# Patient Record
Sex: Female | Born: 1990 | Race: White | Hispanic: No | Marital: Single | Smoking: Never smoker
Health system: Southern US, Community
[De-identification: ages and names within clinical notes are randomized; demographics above are authoritative.]

## PROBLEM LIST (undated history)

## (undated) DIAGNOSIS — L509 Urticaria, unspecified: Secondary | ICD-10-CM

## (undated) DIAGNOSIS — T7840XA Allergy, unspecified, initial encounter: Secondary | ICD-10-CM

## (undated) DIAGNOSIS — J45909 Unspecified asthma, uncomplicated: Secondary | ICD-10-CM

## (undated) DIAGNOSIS — F988 Other specified behavioral and emotional disorders with onset usually occurring in childhood and adolescence: Secondary | ICD-10-CM

## (undated) HISTORY — DX: Unspecified asthma, uncomplicated: J45.909

## (undated) HISTORY — DX: Allergy, unspecified, initial encounter: T78.40XA

## (undated) HISTORY — DX: Urticaria, unspecified: L50.9

---

## 2013-06-08 ENCOUNTER — Encounter (HOSPITAL_BASED_OUTPATIENT_CLINIC_OR_DEPARTMENT_OTHER): Payer: Self-pay | Admitting: Emergency Medicine

## 2013-06-08 ENCOUNTER — Emergency Department (HOSPITAL_BASED_OUTPATIENT_CLINIC_OR_DEPARTMENT_OTHER): Payer: BC Managed Care – PPO

## 2013-06-08 DIAGNOSIS — Z79899 Other long term (current) drug therapy: Secondary | ICD-10-CM | POA: Insufficient documentation

## 2013-06-08 DIAGNOSIS — S93409A Sprain of unspecified ligament of unspecified ankle, initial encounter: Secondary | ICD-10-CM | POA: Insufficient documentation

## 2013-06-08 DIAGNOSIS — Y929 Unspecified place or not applicable: Secondary | ICD-10-CM | POA: Insufficient documentation

## 2013-06-08 DIAGNOSIS — Y939 Activity, unspecified: Secondary | ICD-10-CM | POA: Insufficient documentation

## 2013-06-08 DIAGNOSIS — F988 Other specified behavioral and emotional disorders with onset usually occurring in childhood and adolescence: Secondary | ICD-10-CM | POA: Insufficient documentation

## 2013-06-08 DIAGNOSIS — W010XXA Fall on same level from slipping, tripping and stumbling without subsequent striking against object, initial encounter: Secondary | ICD-10-CM | POA: Insufficient documentation

## 2013-06-08 DIAGNOSIS — Z880 Allergy status to penicillin: Secondary | ICD-10-CM | POA: Insufficient documentation

## 2013-06-08 NOTE — ED Notes (Signed)
Left ankle pain s/p injury, no deformity noted

## 2013-06-09 ENCOUNTER — Emergency Department (HOSPITAL_BASED_OUTPATIENT_CLINIC_OR_DEPARTMENT_OTHER)
Admission: EM | Admit: 2013-06-09 | Discharge: 2013-06-09 | Disposition: A | Discharge status: Home / Self Care | Payer: BC Managed Care – PPO | Attending: Emergency Medicine | Admitting: Emergency Medicine

## 2013-06-09 DIAGNOSIS — S93402A Sprain of unspecified ligament of left ankle, initial encounter: Secondary | ICD-10-CM

## 2013-06-09 HISTORY — DX: Other specified behavioral and emotional disorders with onset usually occurring in childhood and adolescence: F98.8

## 2013-06-09 MED ORDER — IBUPROFEN 600 MG PO TABS: 600.0000 mg | ORAL_TABLET | Freq: Four times a day (QID) | ORAL | Status: DC | PRN

## 2013-06-09 MED ORDER — IBUPROFEN 400 MG PO TABS
600.0000 mg | ORAL_TABLET | Freq: Once | ORAL | Status: AC
  Administered 2013-06-09: 02:00:00 600 mg via ORAL
  Filled 2013-06-09 (×2): qty 1

## 2013-06-09 NOTE — ED Provider Notes (Signed)
CSN: [id number]     Arrival date & time 06/08/13  2238 History   First MD Initiated Contact with Patient 06/09/13 0057     Chief Complaint  Patient presents with  . Ankle Pain   (Consider location/radiation/quality/duration/timing/severity/associated sxs/prior Treatment) HPI Patient presents with left ankle pain and swelling after tripping any dig earlier this evening. She denies any other injury specifically to her head or neck. She is having no numbness or tingling. She has no focal weakness. She denies any pain near around the knee. Past Medical History  Diagnosis Date  . ADD (attention deficit disorder)    History reviewed. No pertinent past surgical history. History reviewed. No pertinent family history. History  Substance Use Topics  . Smoking status: Never Smoker   . Smokeless tobacco: Not on file  . Alcohol Use: No   OB History   Grav Para Term Preterm Abortions TAB SAB Ect Mult Living                 Review of Systems  Musculoskeletal: Positive for arthralgias and joint swelling. Negative for neck pain and neck stiffness.  Skin: Negative for rash.  Neurological: Negative for weakness, numbness and headaches.  All other systems reviewed and are negative.    Allergies  Penicillins  Home Medications   Current Outpatient Rx  Name  Route  Sig  Dispense  Refill  . lisdexamfetamine (VYVANSE) 70 MG capsule   Oral   Take 70 mg by mouth daily.         Marland Kitchen ibuprofen (ADVIL,MOTRIN) 600 MG tablet   Oral   Take 1 tablet (600 mg total) by mouth every 6 (six) hours as needed.   30 tablet   0    LMP 06/06/2013 Physical Exam  Nursing note and vitals reviewed. Constitutional: She is oriented to person, place, and time. She appears well-developed and well-nourished. No distress.  HENT:  Head: Normocephalic and atraumatic.  Eyes: EOM are normal. Pupils are equal, round, and reactive to light.  Neck: Normal range of motion. Neck supple.  No posterior midline tenderness  to palpation.  Cardiovascular: Normal rate and regular rhythm.   Pulmonary/Chest: Effort normal and breath sounds normal. No respiratory distress. She has no wheezes. She has no rales.  Abdominal: Soft. Bowel sounds are normal. She exhibits no distension and no mass. There is no tenderness. There is no rebound and no guarding.  Musculoskeletal: Normal range of motion. She exhibits tenderness (tenderness to palpation over the left lateral malleolus and just distal to the malleolus. She has obvious swelling.). She exhibits no edema.  No foot tenderness. No proximal fibular tenderness. 2+ dorsalis pedis pulses.  Neurological: She is alert and oriented to person, place, and time.  Moves all extremities without deficit. Sensation grossly intact.  Skin: Skin is warm and dry. No rash noted. No erythema.  Psychiatric: She has a normal mood and affect. Her behavior is normal.    ED Course  Procedures (including critical care time) Labs Review Labs Reviewed - No data to display Imaging Review Dg Ankle Complete Left  06/08/2013   CLINICAL DATA:  Trauma with ankle pain and swelling  EXAM: LEFT ANKLE COMPLETE - 3+ VIEW  COMPARISON:  None.  FINDINGS: There is no evidence of fracture, dislocation, or joint effusion. There is no evidence of arthropathy or other focal bone abnormality.  Periarticular soft tissue swelling, especially laterally.  IMPRESSION: Soft tissue swelling without fracture.   Electronically Signed   By: Tiburcio Pea  M.D.   On: 06/08/2013 23:49    EKG Interpretation   None       MDM   1. Ankle sprain, left, initial encounter    We'll treat for ankle sprain. Advised to keep elevated and use ice for swelling. Return precautions have been given.    Loren Raceravid Juel Ripley, MD 06/09/13 [phone]

## 2013-06-09 NOTE — Discharge Instructions (Signed)

## 2013-11-02 DIAGNOSIS — S93439A Sprain of tibiofibular ligament of unspecified ankle, initial encounter: Secondary | ICD-10-CM | POA: Insufficient documentation

## 2014-01-18 DIAGNOSIS — M659 Unspecified synovitis and tenosynovitis, unspecified site: Secondary | ICD-10-CM | POA: Insufficient documentation

## 2015-02-27 IMAGING — CR DG ANKLE COMPLETE 3+V*L*
3 series · 3 of 3 positions shown · non-contrast
Comparison: None.

CLINICAL DATA: Trauma with ankle pain and swelling

EXAM:
LEFT ANKLE COMPLETE - 3+ VIEW

[t ankle joint ap left]
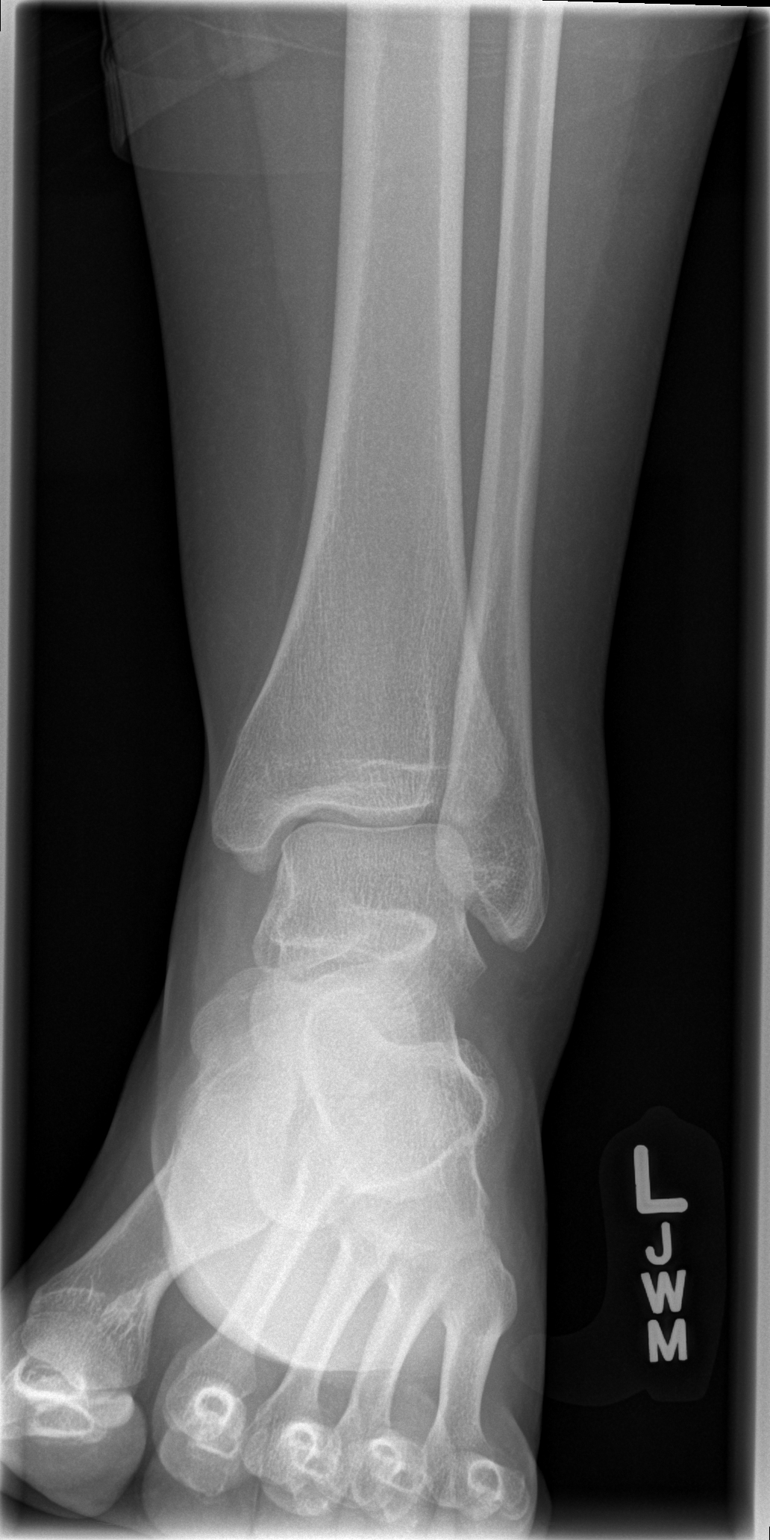

[t ankle joint oblique left]
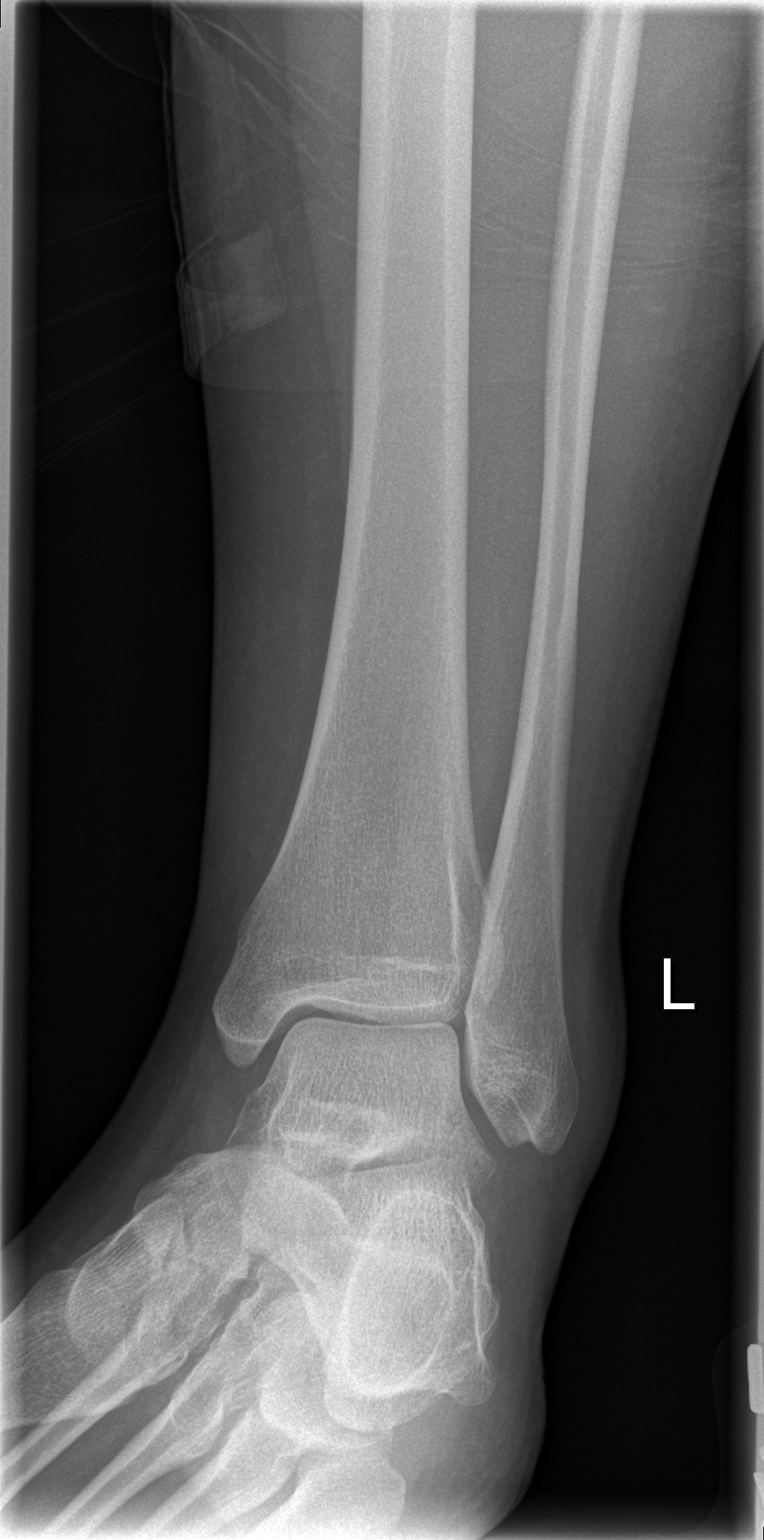

[t ankle joint lat left]
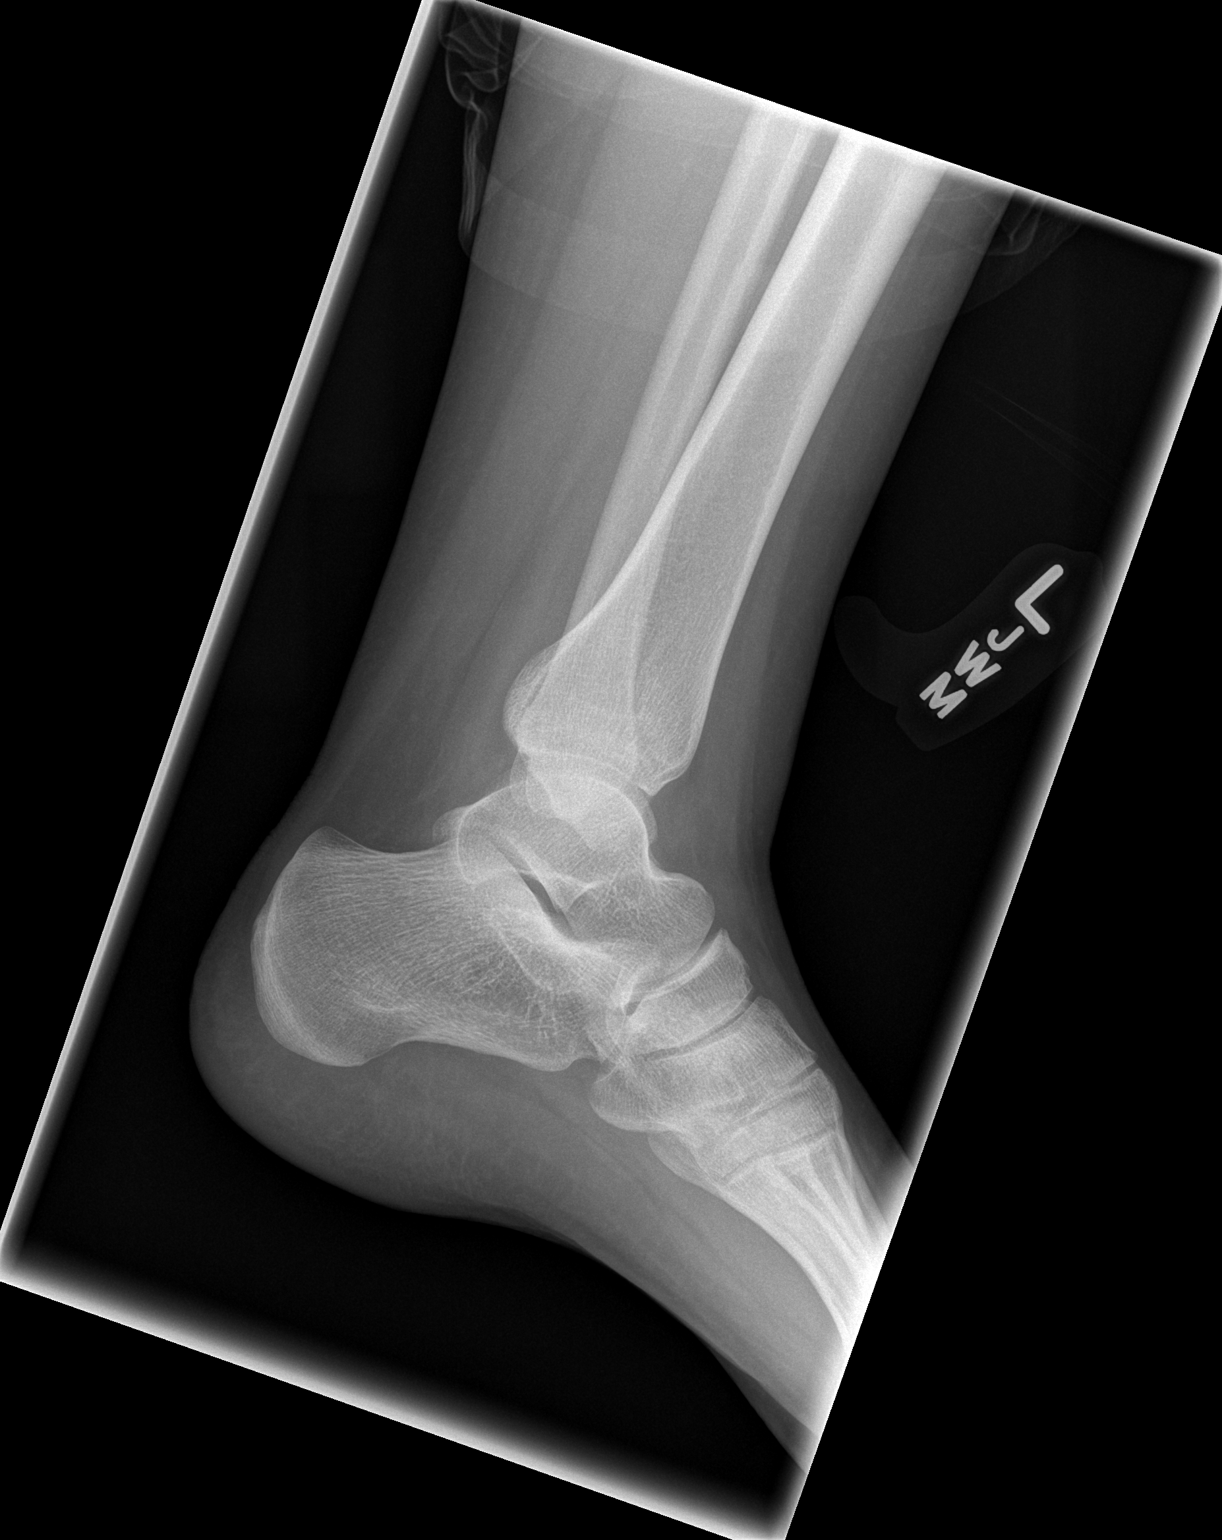

[3 of 3 positions shown; findings below may reference images not displayed]

FINDINGS: There is no evidence of fracture, dislocation, or joint effusion.
There is no evidence of arthropathy or other focal bone abnormality.

Periarticular soft tissue swelling, especially laterally.
IMPRESSION: Soft tissue swelling without fracture.

## 2017-04-28 ENCOUNTER — Encounter (HOSPITAL_BASED_OUTPATIENT_CLINIC_OR_DEPARTMENT_OTHER): Payer: Self-pay

## 2017-04-28 ENCOUNTER — Emergency Department (HOSPITAL_BASED_OUTPATIENT_CLINIC_OR_DEPARTMENT_OTHER)
Admission: EM | Admit: 2017-04-28 | Discharge: 2017-04-28 | Disposition: A | Discharge status: Home / Self Care | Payer: Worker's Compensation | Attending: Emergency Medicine | Admitting: Emergency Medicine

## 2017-04-28 DIAGNOSIS — S61211A Laceration without foreign body of left index finger without damage to nail, initial encounter: Secondary | ICD-10-CM | POA: Diagnosis not present

## 2017-04-28 DIAGNOSIS — W260XXA Contact with knife, initial encounter: Secondary | ICD-10-CM | POA: Insufficient documentation

## 2017-04-28 DIAGNOSIS — Y929 Unspecified place or not applicable: Secondary | ICD-10-CM | POA: Insufficient documentation

## 2017-04-28 DIAGNOSIS — Y998 Other external cause status: Secondary | ICD-10-CM | POA: Insufficient documentation

## 2017-04-28 DIAGNOSIS — Z23 Encounter for immunization: Secondary | ICD-10-CM | POA: Diagnosis not present

## 2017-04-28 DIAGNOSIS — Y93G1 Activity, food preparation and clean up: Secondary | ICD-10-CM | POA: Insufficient documentation

## 2017-04-28 MED ORDER — TETANUS-DIPHTH-ACELL PERTUSSIS 5-2.5-18.5 LF-MCG/0.5 IM SUSP
0.5000 mL | Freq: Once | INTRAMUSCULAR | Status: AC
  Administered 2017-04-28: 0.5 mL via INTRAMUSCULAR
  Filled 2017-04-28: qty 0.5

## 2017-04-28 MED ORDER — BUPIVACAINE HCL 0.25 % IJ SOLN
5.0000 mL | Freq: Once | INTRAMUSCULAR | Status: AC
  Administered 2017-04-28: 5 mL
  Filled 2017-04-28: qty 1

## 2017-04-28 NOTE — ED Provider Notes (Signed)
[facility] EMERGENCY DEPARTMENT Provider Note   CSN: [id number] Arrival date & time: 04/28/17  1101     History   Chief Complaint Chief Complaint  Patient presents with  . Laceration    index finger    HPI Janet Spencer is a 26 y.o. female.  HPI  Patient is a 26 year old female with a history of ADD presenting for a laceration to her left index finger.  Patient reports that she was cutting lemons at work, where she works in [employer], [employer] at a [employer] when her finger slid under the knife.  Hemostasis immediately applied. Patient reports she has decreased sensation distal to the cut, but no loss of sensation. No weakness of the finger. Tdap not up to date.  Past Medical History:  Diagnosis Date  . ADD (attention deficit disorder)     There are no active problems to display for this patient.   History reviewed. No pertinent surgical history.  OB History    No data available       Home Medications    Prior to Admission medications   Medication Sig Start Date End Date Taking? Authorizing Provider  ibuprofen (ADVIL,MOTRIN) 600 MG tablet Take 1 tablet (600 mg total) by mouth every 6 (six) hours as needed. 06/09/13   Loren RacerYelverton, David, MD  lisdexamfetamine (VYVANSE) 70 MG capsule Take 70 mg by mouth daily.    [provider]    Family History No family history on file.  Social History Social History   Tobacco Use  . Smoking status: Never Smoker  . Smokeless tobacco: Never Used  Substance Use Topics  . Alcohol use: No  . Drug use: No     Allergies   Penicillins   Review of Systems Review of Systems  Skin: Positive for wound.  Neurological: Negative for weakness and numbness.     Physical Exam Updated Vital Signs BP 115/76 (BP Location: Right Arm)   Pulse 69   Temp 98.1 F (36.7 C) (Oral)   Resp 18   Ht 5\' 9"  (1.753 m)   Wt 62.1 kg (137 lb)   SpO2 100%   BMI 20.23 kg/m   Physical Exam  Constitutional: She appears  well-developed and well-nourished. No distress.  Sitting comfortably in bed.  HENT:  Head: Normocephalic and atraumatic.  Eyes: Conjunctivae are normal. Right eye exhibits no discharge. Left eye exhibits no discharge.  EOMs normal to gross examination.  Neck: Normal range of motion.  Cardiovascular: Normal rate and regular rhythm.  Intact, 2+ radial and ulnar pulse of the left hand.  Pulmonary/Chest:  Normal respiratory effort. Patient converses comfortably. No audible wheeze or stridor.  Abdominal: She exhibits no distension.  Musculoskeletal: Normal range of motion.  Left Hand Exam:  Inspection: No swelling or deformity. There is a 1cm superficial laceration overlying the extensor surface of the left 2nd digit, extending up to but not involving the nail matrix. Palpation: No point tenderness, crepitus, tenderness over anatomic snuffbox, or scapholunate joint tenderness ROM: Passive/active ROM intact at wrist, MCP, PIP, and DIP joints, thumb MCP and IP joints. Ligamentous stability: No laxity to valgus/varus stress of MCP, PIP, or DIP joints. No joint laxity with radial stress of thumb. Flexor/Extensor tendons: FDS/FDP tendons intact in digits 2-5 at PIP/DIP joints, respectively; extensor tendons intact in all digits Nerve testing: -Sensation to light touch intact to the distal tip of the left 2nd digit.  -Radial: Sensation to light touch intact at dorsal CMC joint. -Median: Sensation to  light touch intact on palmar side of 1st and 2nd digits. -Ulnar: Sensation to light touch intact on palmar side of 5th digit.  Vascular: 2+ radial and ulnar pulses. Capillary refill <2 seconds  Neurological: She is alert.  Cranial nerves intact to gross observation. Patient moves extremities symmetrically and with good coordination.  Skin: Skin is warm and dry. She is not diaphoretic.  Psychiatric: She has a normal mood and affect. Her behavior is normal. Judgment and thought content normal.  Nursing  note and vitals reviewed.    ED Treatments / Results  Labs (all labs ordered are listed, but only abnormal results are displayed) Labs Reviewed - No data to display  EKG  EKG Interpretation None       Radiology No results found.  Procedures .Marland Kitchen.Laceration Repair Date/Time: 04/29/2017 9:07 AM Performed by: Elisha PonderMurray, Lakashia Collison B, PA-C Authorized by: Elisha PonderMurray, Falana Clagg B, PA-C   Consent:    Consent obtained:  Verbal   Consent given by:  Patient   Risks discussed:  Infection, pain and poor cosmetic result Anesthesia (see MAR for exact dosages):    Anesthesia method:  Nerve block   Block needle gauge:  25 G   Block anesthetic:  Bupivacaine 0.25% w/o epi   Block injection procedure:  Anatomic landmarks identified, incremental injection and introduced needle   Block outcome:  Anesthesia achieved Laceration details:    Location:  Finger   Finger location:  L index finger   Length (cm):  1 Pre-procedure details:    Preparation:  Patient was prepped and draped in usual sterile fashion Exploration:    Hemostasis achieved with:  Tourniquet   Contaminated: no   Treatment:    Area cleansed with:  Betadine   Amount of cleaning:  Standard   Irrigation solution:  Tap water   Irrigation volume:  3 minutes under tap water Skin repair:    Repair method:  Sutures   Suture size:  5-0   Suture material:  Prolene   Number of sutures:  3 Approximation:    Approximation:  Close   Vermilion border: well-aligned   Post-procedure details:    Dressing:  Splint for protection and non-adherent dressing   Patient tolerance of procedure:  Tolerated well, no immediate complications   (including critical care time)  Medications Ordered in ED Medications  Tdap (BOOSTRIX) injection 0.5 mL (0.5 mLs Intramuscular Given 04/28/17 1252)  bupivacaine (MARCAINE) 0.25 % (with pres) injection 5 mL (5 mLs Infiltration Given 04/28/17 1258)     Initial Impression / Assessment and Plan / ED Course  I have  reviewed the triage vital signs and the nursing notes.  Pertinent labs & imaging results that were available during my care of the patient were reviewed by me and considered in my medical decision making (see chart for details).     Final Clinical Impressions(s) / ED Diagnoses   Final diagnoses:  Laceration of left index finger without foreign body without damage to nail, initial encounter   Patient with superficial laceration to the extensor surface of left index finger. Patient is neurovascularly intact. Repaired with 3 prolene sutures today. Tdap updated today. Patient tolerated procedure well. Return precautions given for any erythema, drainage, or increased swelling. Patient is in understanding and agrees with the plan of care.   ED Discharge Orders    None       Delia ChimesMurray, Marquel Spoto B, PA-C 04/29/17 [id number]    Rolan BuccoBelfi, Melanie, MD 04/29/17 [phone]

## 2017-04-28 NOTE — Discharge Instructions (Signed)
Please read and follow all provided instructions.  Your diagnoses today include:  1. Laceration of left index finger without foreign body without damage to nail, initial encounter     Tests performed today include: Vital signs. See below for your results today.   Medications prescribed:   Take any prescribed medications only as directed.    Home care instructions:  Follow any educational materials and wound care instructions contained in this packet.   Keep affected area above the level of your heart when possible to minimize swelling. Wash area gently twice a day with warm soapy water. Do not apply alcohol or hydrogen peroxide. Cover the area if it draining or weeping.   Follow-up instructions: Suture Removal: Return to the Emergency Department or see your primary care care doctor in 7-10 days for a recheck of your wound and removal of your sutures or staples.    Return instructions:  Return to the Emergency Department if you have: Fever Worsening pain Worsening swelling of the wound Pus draining from the wound Redness of the skin that moves away from the wound, especially if it streaks away from the affected area  Any other emergent concerns  Your vital signs today were: BP 128/80 (BP Location: Left Arm)    Pulse 70    Temp 98.1 F (36.7 C) (Oral)    Resp 16    Ht 5\' 9"  (1.753 m)    Wt 62.1 kg (137 lb)    SpO2 100%    BMI 20.23 kg/m  If your blood pressure (BP) was elevated above 135/85 this visit, please have this repeated by your doctor within one month. --------------

## 2017-04-28 NOTE — ED Notes (Signed)
D/c home with family and given note for work

## 2017-04-28 NOTE — ED Triage Notes (Signed)
Pt cut her lt index finger while cutting lemons at work; bleeding controlled

## 2017-04-28 NOTE — ED Notes (Signed)
Per pt's manager "Pt needs a urine drug screen" EMT relayed information to pt and pt attempted to provide a sample for urine drug screen and did not give enough urine to get a temperature off the sample. EMT advised pt she has a 45 minute window to be able to provide another sample or we cannot perform the drug screen. EMT relayed such information to pt's manager and then pt's manager stated "Then just forego the test if she's going to have to wait. Just don't worry about it."

## 2017-04-28 NOTE — ED Notes (Signed)
EMT at bedside applying splint 

## 2017-04-28 NOTE — ED Notes (Signed)
ED Provider at bedside. 

## 2018-11-29 ENCOUNTER — Ambulatory Visit (INDEPENDENT_AMBULATORY_CARE_PROVIDER_SITE_OTHER): Payer: BC Managed Care – PPO | Admitting: Allergy and Immunology

## 2018-11-29 ENCOUNTER — Other Ambulatory Visit: Payer: Self-pay

## 2018-11-29 ENCOUNTER — Encounter: Payer: Self-pay | Admitting: Allergy and Immunology

## 2018-11-29 VITALS — HR 72 | Resp 16 | Ht 68.0 in | Wt 156.7 lb

## 2018-11-29 DIAGNOSIS — J3089 Other allergic rhinitis: Secondary | ICD-10-CM

## 2018-11-29 DIAGNOSIS — R062 Wheezing: Secondary | ICD-10-CM

## 2018-11-29 DIAGNOSIS — H1013 Acute atopic conjunctivitis, bilateral: Secondary | ICD-10-CM

## 2018-11-29 DIAGNOSIS — L500 Allergic urticaria: Secondary | ICD-10-CM

## 2018-11-29 DIAGNOSIS — J4530 Mild persistent asthma, uncomplicated: Secondary | ICD-10-CM | POA: Insufficient documentation

## 2018-11-29 DIAGNOSIS — T7840XD Allergy, unspecified, subsequent encounter: Secondary | ICD-10-CM

## 2018-11-29 DIAGNOSIS — H1010 Acute atopic conjunctivitis, unspecified eye: Secondary | ICD-10-CM | POA: Insufficient documentation

## 2018-11-29 DIAGNOSIS — K2970 Gastritis, unspecified, without bleeding: Secondary | ICD-10-CM

## 2018-11-29 DIAGNOSIS — T7840XA Allergy, unspecified, initial encounter: Secondary | ICD-10-CM | POA: Insufficient documentation

## 2018-11-29 MED ORDER — FLUTICASONE PROPIONATE 50 MCG/ACT NA SUSP: NASAL | 5 refills | Status: DC

## 2018-11-29 MED ORDER — LEVOCETIRIZINE DIHYDROCHLORIDE 5 MG PO TABS: 5.0000 mg | ORAL_TABLET | Freq: Every day | ORAL | 5 refills | Status: AC | PRN

## 2018-11-29 MED ORDER — FAMOTIDINE 20 MG PO TABS: ORAL_TABLET | ORAL | 5 refills | Status: DC

## 2018-11-29 MED ORDER — ALBUTEROL SULFATE HFA 108 (90 BASE) MCG/ACT IN AERS: 2.0000 | INHALATION_SPRAY | RESPIRATORY_TRACT | 1 refills | Status: DC | PRN

## 2018-11-29 MED ORDER — PAZEO 0.7 % OP SOLN: 1.0000 [drp] | Freq: Every day | OPHTHALMIC | 5 refills | Status: DC | PRN

## 2018-11-29 NOTE — Assessment & Plan Note (Signed)
The patient's history suggests mild intermittent asthma.  Spirometry today, while asymptomatic, does not meet ATS criteria for that diagnosis.  A prescription has been provided for albuterol HFA, 1 to 2 inhalations every 4-6 hours as needed.  The albuterol will be used as a therapeutic trial.  Subjective and objective measures of pulmonary function will be followed and the treatment plan will be adjusted accordingly.

## 2018-11-29 NOTE — Assessment & Plan Note (Signed)
   Treatment plan as outlined above for allergic rhinitis.  A prescription has been provided for Pazeo, one drop per eye daily as needed.  I have also recommended eye lubricant drops (i.e., Natural Tears) as needed. 

## 2018-11-29 NOTE — Progress Notes (Signed)
New Patient Note  RE: Janet Spencer MRN: [id number] DOB: [birth year] Date of Office Visit: 11/29/2018  Referring provider: Orvis Brill, MD Primary care provider: Orvis Brill, MD  Chief Complaint: Allergic Rhinitis  and Urticaria  History of present illness: Janet Spencer is a 28 y.o. female seen today in consultation requested by Janet Iha, MD.  Over the past 15 years, Janet Spencer has experienced recurrent episodes of hives. The hives have appeared at different times over her neck, back, arms, legs and feet.  The lesions are described as erythematous, raised, and pruritic.  Individual hives last less than 24 hours without leaving residual pigmentation or bruising. She denies concomitant angioedema, cardiopulmonary symptoms and GI symptoms. She has not experienced unexpected weight loss, recurrent fevers or drenching night sweats. Worse in spring and summer and with hot showers. Otherwise, no specific medication, food, skin care product, detergent, soap, or other environmental triggers have been identified. The symptoms do not seem to correlate with NSAIDs use or emotional stress. She did not have symptoms consistent with a respiratory tract infection at the time of symptom onset. Janet Spencer has tried to control symptoms with OTC antihistamines which have offered minimal temporary relief. She has not been evaluated and treated in the emergency department for these symptoms. Skin biopsy has not been performed. Recently, she has been experiencing hives a few times a week.  Janet Spencer experiences occasional heartburn and at one point was prescribed a proton pump inhibitor for gastroesophageal reflux disease, however she never started this medication. Janet Spencer experiences nasal congestion, rhinorrhea, sneezing, postnasal drainage, nasal pruritus, ocular pruritus, and lacrimation.  She states that on occasion she experiences ocular "on fire itching."  These symptoms occur year-round but are more frequent and  severe with pollen exposure in the springtime and in the fall.  She attempts to control the symptoms with cetirizine and/or Bepreve eyedrops. Janet Spencer complains of occasional dyspnea and wheezing.  Most recently, this occurred approximately 2 months ago while she was at work.  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  She also experiences dyspnea and "trouble catching (her) breath" with exercise.  Assessment and plan: Recurrent urticaria Unclear etiology. Skin tests to select food allergens were negative today. NSAIDs and emotional stress commonly exacerbate urticaria but are not the underlying etiology in this case. Physical urticarias are negative by history (i.e. pressure-induced, temperature, vibration, solar, etc.). History and lesions are not consistent with urticaria pigmentosa so I am not suspicious for mastocytosis. There are no concomitant symptoms concerning for anaphylaxis or constitutional symptoms worrisome for an underlying malignancy. We will rule out other potential etiologies with labs. For symptom relief, patient is to take oral antihistamines as directed. The following labs have been ordered: FCeRI antibody, anti-thyroglobulin antibody, thyroid peroxidase antibody, tryptase, urea breath test, CBC, CMP, ESR, ANA, and alpha gal panel.  The patient will be called with further recommendations after lab results have returned.  Instructions have been discussed and provided for H1/H2 receptor blockade with titration to find lowest effective dose.  A prescription has been provided for levocetirizine (Xyzal), 5 mg daily as needed.  A prescription has been provided for famotidine (Pepcid) 20 mg twice daily as needed.  Should there be a significant increase or change in symptoms, a journal is to be kept recording any foods eaten, beverages consumed, medications taken within a 6 hour period prior to the onset of symptoms, as well as record activities being performed, and  environmental conditions. For any symptoms concerning  for anaphylaxis, 911 is to be called immediately.  Perennial and seasonal allergic rhinitis  Aeroallergen avoidance measures have been discussed and provided in written form.  Levocetirizine has been prescribed (as above).  A prescription has been provided for fluticasone nasal spray, 2 sprays per nostril daily as needed. Proper nasal spray technique has been discussed and demonstrated.  Nasal saline spray (i.e., Simply Saline) or nasal saline lavage (i.e., NeilMed) is recommended as needed and prior to medicated nasal sprays.  If allergen avoidance measures and medications fail to adequately relieve symptoms, aeroallergen immunotherapy will be considered.  Allergic conjunctivitis  Treatment plan as outlined above for allergic rhinitis.  A prescription has been provided for Pazeo, one drop per eye daily as needed.  I have also recommended eye lubricant drops (i.e., Natural Tears) as needed.  Wheezing The patient's history suggests mild intermittent asthma.  Spirometry today, while asymptomatic, does not meet ATS criteria for that diagnosis.  A prescription has been provided for albuterol HFA, 1 to 2 inhalations every 4-6 hours as needed.  The albuterol will be used as a therapeutic trial.  Subjective and objective measures of pulmonary function will be followed and the treatment plan will be adjusted accordingly.   Meds ordered this encounter  Medications   levocetirizine (XYZAL) 5 MG tablet    Sig: Take 1 tablet (5 mg total) by mouth daily as needed for allergies. One tablet twice daily as needed for itching    Dispense:  30 tablet    Refill:  5   fluticasone (FLONASE) 50 MCG/ACT nasal spray    Sig: 2 sprays per nostril daily as needed for stuffy nose.    Dispense:  16 g    Refill:  5   Olopatadine HCl (PAZEO) 0.7 % SOLN    Sig: Place 1 drop into both eyes daily as needed.    Dispense:  2.5 mL    Refill:  5     Please keep rx on file. Pt. Will call when needed/   albuterol (PROAIR HFA) 108 (90 Base) MCG/ACT inhaler    Sig: Inhale 2 puffs into the lungs every 4 (four) hours as needed for wheezing or shortness of breath.    Dispense:  8 g    Refill:  1   famotidine (PEPCID) 20 MG tablet    Sig: 1 tablet twice daily.    Dispense:  64 tablet    Refill:  5    Diagnostics: Spirometry: FVC was 3.06 L and FEV1 with 2.90 L (79% predicted) with 60 mL postbronchodilator improvement.  See spirometry asymptomatic. Environmental skin testing: Positive to grass pollen, molds, cat hair, and dust mite antigen. Food allergen skin testing: Negative despite a positive histamine control.    Physical examination: Pulse 72, resp. rate 16, height '5\' 8"'  (1.727 m), weight 156 lb 12 oz (71.1 kg).  General: Alert, interactive, in no acute distress. HEENT: TMs pearly gray, turbinates moderately edematous with clear discharge, post-pharynx moderately erythematous. Neck: Supple without lymphadenopathy. Lungs: Clear to auscultation without wheezing, rhonchi or rales. CV: Normal S1, S2 without murmurs. Abdomen: Nondistended, nontender. Skin: Warm and dry, without lesions or rashes. Extremities:  No clubbing, cyanosis or edema. Neuro:   Grossly intact.  Review of systems:  Review of systems negative except as noted in HPI / PMHx or noted below: Review of Systems  Constitutional: Negative.   HENT: Negative.   Eyes: Negative.   Respiratory: Negative.   Cardiovascular: Negative.   Gastrointestinal: Negative.   Genitourinary: Negative.  Musculoskeletal: Negative.   Skin: Negative.   Neurological: Negative.   Endo/Heme/Allergies: Negative.   Psychiatric/Behavioral: Negative.     Past medical history:  Past Medical History:  Diagnosis Date   ADD (attention deficit disorder)    Urticaria     Past surgical history:  History reviewed. No pertinent surgical history.  Family history: Family History    Problem Relation Age of Onset   Allergic rhinitis Maternal Grandmother    Angioedema Neg Hx    Asthma Neg Hx    Eczema Neg Hx    Immunodeficiency Neg Hx    Urticaria Neg Hx     Social history: Social History   Socioeconomic History   Marital status: Single    Spouse name: Not on file   Number of children: Not on file   Years of education: Not on file   Highest education level: Not on file  Occupational History   Not on file  Social Needs   Financial resource strain: Not on file   Food insecurity    Worry: Not on file    Inability: Not on file   Transportation needs    Medical: Not on file    Non-medical: Not on file  Tobacco Use   Smoking status: Never Smoker   Smokeless tobacco: Never Used  Substance and Sexual Activity   Alcohol use: No   Drug use: No   Sexual activity: Not on file  Lifestyle   Physical activity    Days per week: Not on file    Minutes per session: Not on file   Stress: Not on file  Relationships   Social connections    Talks on phone: Not on file    Gets together: Not on file    Attends religious service: Not on file    Active member of club or organization: Not on file    Attends meetings of clubs or organizations: Not on file    Relationship status: Not on file   Intimate partner violence    Fear of current or ex partner: Not on file    Emotionally abused: Not on file    Physically abused: Not on file    Forced sexual activity: Not on file  Other Topics Concern   Not on file  Social History Narrative   Not on file   Environmental History: The patient lives in a 26 year old house with kerosene heat and window air conditioning units.  There is mold/water damage in the home.  There are no pets in the home.  She is a non-smoker.  Allergies as of 11/29/2018      Reactions   Penicillins       Medication List       Accurate as of November 29, 2018  5:06 PM. If you have any questions, ask your nurse or doctor.         STOP taking these medications   ibuprofen 600 MG tablet Commonly known as: ADVIL Stopped by: Janet Lynch, MD   lisdexamfetamine 70 MG capsule Commonly known as: VYVANSE Stopped by: Janet Lynch, MD     TAKE these medications   albuterol 108 (90 Base) MCG/ACT inhaler Commonly known as: ProAir HFA Inhale 2 puffs into the lungs every 4 (four) hours as needed for wheezing or shortness of breath. Started by: Janet Lynch, MD   Bepreve 1.5 % Soln Generic drug: Bepotastine Besilate PLACE 1 DROP IN INTO BOTH EYES TWICE A DAY AS NEEDED FOR ALLERGIES  cetirizine 10 MG tablet Commonly known as: ZYRTEC Take by mouth.   famotidine 20 MG tablet Commonly known as: Pepcid 1 tablet twice daily. Started by: Janet Lynch, MD   fluticasone 50 MCG/ACT nasal spray Commonly known as: FLONASE 2 sprays per nostril daily as needed for stuffy nose. Started by: Janet Lynch, MD   levocetirizine 5 MG tablet Commonly known as: XYZAL Take 1 tablet (5 mg total) by mouth daily as needed for allergies. One tablet twice daily as needed for itching Started by: Janet Lynch, MD   Levonorgestrel-Ethinyl Estradiol 0.1-0.02 & 0.01 MG tablet Commonly known as: AMETHIA Take by mouth.   Pazeo 0.7 % Soln Generic drug: Olopatadine HCl Place 1 drop into both eyes daily as needed. Started by: Janet Lynch, MD       Known medication allergies: Allergies  Allergen Reactions   Penicillins     I appreciate the opportunity to take part in Saydie's care. Please do not hesitate to contact me with questions.  Sincerely,   R. Edgar Frisk, MD

## 2018-11-29 NOTE — Assessment & Plan Note (Signed)
Unclear etiology. Skin tests to select food allergens were negative today. NSAIDs and emotional stress commonly exacerbate urticaria but are not the underlying etiology in this case. Physical urticarias are negative by history (i.e. pressure-induced, temperature, vibration, solar, etc.). History and lesions are not consistent with urticaria pigmentosa so I am not suspicious for mastocytosis. There are no concomitant symptoms concerning for anaphylaxis or constitutional symptoms worrisome for an underlying malignancy. We will rule out other potential etiologies with labs. For symptom relief, patient is to take oral antihistamines as directed. The following labs have been ordered: FCeRI antibody, anti-thyroglobulin antibody, thyroid peroxidase antibody, tryptase, urea breath test, CBC, CMP, ESR, ANA, and alpha gal panel.  The patient will be called with further recommendations after lab results have returned.  Instructions have been discussed and provided for H1/H2 receptor blockade with titration to find lowest effective dose.  A prescription has been provided for levocetirizine (Xyzal), 5 mg daily as needed.  A prescription has been provided for famotidine (Pepcid) 20 mg twice daily as needed.  Should there be a significant increase or change in symptoms, a journal is to be kept recording any foods eaten, beverages consumed, medications taken within a 6 hour period prior to the onset of symptoms, as well as record activities being performed, and environmental conditions. For any symptoms concerning for anaphylaxis, 911 is to be called immediately.

## 2018-11-29 NOTE — Assessment & Plan Note (Signed)
   Aeroallergen avoidance measures have been discussed and provided in written form.  Levocetirizine has been prescribed (as above).  A prescription has been provided for fluticasone nasal spray, 2 sprays per nostril daily as needed. Proper nasal spray technique has been discussed and demonstrated.  Nasal saline spray (i.e., Simply Saline) or nasal saline lavage (i.e., NeilMed) is recommended as needed and prior to medicated nasal sprays.  If allergen avoidance measures and medications fail to adequately relieve symptoms, aeroallergen immunotherapy will be considered. 

## 2018-11-29 NOTE — Patient Instructions (Addendum)
Recurrent urticaria Unclear etiology. Skin tests to select food allergens were negative today. NSAIDs and emotional stress commonly exacerbate urticaria but are not the underlying etiology in this case. Physical urticarias are negative by history (i.e. pressure-induced, temperature, vibration, solar, etc.). History and lesions are not consistent with urticaria pigmentosa so I am not suspicious for mastocytosis. There are no concomitant symptoms concerning for anaphylaxis or constitutional symptoms worrisome for an underlying malignancy. We will rule out other potential etiologies with labs. For symptom relief, patient is to take oral antihistamines as directed. The following labs have been ordered: FCeRI antibody, anti-thyroglobulin antibody, thyroid peroxidase antibody, tryptase, urea breath test, CBC, CMP, ESR, ANA, and alpha gal panel.  The patient will be called with further recommendations after lab results have returned.  Instructions have been discussed and provided for H1/H2 receptor blockade with titration to find lowest effective dose.  A prescription has been provided for levocetirizine (Xyzal), 5 mg daily as needed.  A prescription has been provided for famotidine (Pepcid) 20 mg twice daily as needed.  Should there be a significant increase or change in symptoms, a journal is to be kept recording any foods eaten, beverages consumed, medications taken within a 6 hour period prior to the onset of symptoms, as well as record activities being performed, and environmental conditions. For any symptoms concerning for anaphylaxis, 911 is to be called immediately.  Perennial and seasonal allergic rhinitis  Aeroallergen avoidance measures have been discussed and provided in written form.  Levocetirizine has been prescribed (as above).  A prescription has been provided for fluticasone nasal spray, 2 sprays per nostril daily as needed. Proper nasal spray technique has been discussed and  demonstrated.  Nasal saline spray (i.e., Simply Saline) or nasal saline lavage (i.e., NeilMed) is recommended as needed and prior to medicated nasal sprays.  If allergen avoidance measures and medications fail to adequately relieve symptoms, aeroallergen immunotherapy will be considered.  Allergic conjunctivitis  Treatment plan as outlined above for allergic rhinitis.  A prescription has been provided for Pazeo, one drop per eye daily as needed.  I have also recommended eye lubricant drops (i.e., Natural Tears) as needed.  Wheezing The patient's history suggests mild intermittent asthma.  Spirometry today, while asymptomatic, does not meet ATS criteria for that diagnosis.  A prescription has been provided for albuterol HFA, 1 to 2 inhalations every 4-6 hours as needed.  The albuterol will be used as a therapeutic trial.  Subjective and objective measures of pulmonary function will be followed and the treatment plan will be adjusted accordingly.   When lab results have returned the patient will be called with further recommendations and follow up instructions.  Urticaria (Hives)  . Levocetirizine (Xyzal) 5 mg twice a day and famotidine (Pepcid) 20 mg twice a day. If no symptoms for 7-14 days then decrease to. . Levocetirizine (Xyzal) 5 mg twice a day and famotidine (Pepcid) 20 mg once a day.  If no symptoms for 7-14 days then decrease to. . Levocetirizine (Xyzal) 5 mg twice a day.  If no symptoms for 7-14 days then decrease to. . Levocetirizine (Xyzal) 5 mg once a day.  May use Benadryl (diphenhydramine) as needed for breakthrough symptoms       If symptoms return, then step up dosage  Control of [place] dust mites play a major role in allergic asthma and rhinitis.  They occur in environments with high humidity wherever human skin, the food for dust mites is found. High  levels have been detected in dust obtained from mattresses, pillows, carpets,  upholstered furniture, bed covers, clothes and soft toys.  The principal allergen of the house dust mite is found in its feces.  A gram of dust may contain 1,000 mites and 250,000 fecal particles.  Mite antigen is easily measured in the air during house cleaning activities.    1. Encase mattresses, including the box spring, and pillow, in an air tight cover.  Seal the zipper end of the encased mattresses with wide adhesive tape. 2. Wash the bedding in water of 130 degrees Farenheit weekly.  Avoid cotton comforters/quilts and flannel bedding: the most ideal bed covering is the dacron comforter. 3. Remove all upholstered furniture from the bedroom. 4. Remove carpets, carpet padding, rugs, and non-washable window drapes from the bedroom.  Wash drapes weekly or use plastic window coverings. 5. Remove all non-washable stuffed toys from the bedroom.  Wash stuffed toys weekly. 6. Have the room cleaned frequently with a vacuum cleaner and a damp dust-mop.  The patient should not be in a room which is being cleaned and should wait 1 hour after cleaning before going into the room. 7. Close and seal all heating outlets in the bedroom.  Otherwise, the room will become filled with dust-laden air.  An electric heater can be used to heat the room. 8. Reduce indoor humidity to less than 50%.  Do not use a humidifier.  Control of Dog or Cat Allergen  Avoidance is the best way to manage a dog or cat allergy. If you have a dog or cat and are allergic to dog or cats, consider removing the dog or cat from the home. If you have a dog or cat but don't want to find it a new home, or if your family wants a pet even though someone in the household is allergic, here are some strategies that may help keep symptoms at bay:  1. Keep the pet out of your bedroom and restrict it to only a few rooms. Be advised that keeping the dog or cat in only one room will not limit the allergens to that room. 2. Don't pet, hug or kiss the dog  or cat; if you do, wash your hands with soap and water. 3. High-efficiency particulate air (HEPA) cleaners run continuously in a bedroom or living room can reduce allergen levels over time. 4. Place electrostatic material sheet in the air inlet vent in the bedroom. 5. Regular use of a high-efficiency vacuum cleaner or a central vacuum can reduce allergen levels. 6. Giving your dog or cat a bath at least once a week can reduce airborne allergen.  Control of Mold Allergen  Mold and fungi can grow on a variety of surfaces provided certain temperature and moisture conditions exist.  Outdoor molds grow on plants, decaying vegetation and soil.  The major outdoor mold, Alternaria and Cladosporium, are found in very high numbers during hot and dry conditions.  Generally, a late Summer - Fall peak is seen for common outdoor fungal spores.  Rain will temporarily lower outdoor mold spore count, but counts rise rapidly when the rainy period ends.  The most important indoor molds are Aspergillus and Penicillium.  Dark, humid and poorly ventilated basements are ideal sites for mold growth.  The next most common sites of mold growth are the bathroom and the kitchen.  Outdoor [employer] 1. Use air conditioning and keep windows closed 2. Avoid exposure to decaying vegetation. 3. Avoid leaf raking. 4.  Avoid grain handling. 5. Consider wearing a face mask if working in moldy areas.  Indoor Mold Control 1. Maintain humidity below 50%. 2. Clean washable surfaces with 5% bleach solution. 3. Remove sources e.g. Contaminated carpets.  Reducing Pollen Exposure  The American Academy of Allergy, Asthma and Immunology suggests the following steps to reduce your exposure to pollen during allergy seasons.    1. Do not hang sheets or clothing out to dry; pollen may collect on these items. 2. Do not mow lawns or spend time around freshly cut grass; mowing stirs up pollen. 3. Keep windows closed at night.  Keep car  windows closed while driving. 4. Minimize morning activities outdoors, a time when pollen counts are usually at their highest. 5. Stay indoors as much as possible when pollen counts or humidity is high and on windy days when pollen tends to remain in the air longer. 6. Use air conditioning when possible.  Many air conditioners have filters that trap the pollen spores. 7. Use a HEPA room air filter to remove pollen form the indoor air you breathe.

## 2018-12-08 LAB — H. PYLORI BREATH TEST: H pylori Breath Test: NEGATIVE

## 2018-12-13 LAB — COMPREHENSIVE METABOLIC PANEL
ALT: 23 IU/L (ref 0–32)
AST: 21 IU/L (ref 0–40)
Albumin/Globulin Ratio: 1.5 (ref 1.2–2.2)
Albumin: 4.5 g/dL (ref 3.9–5.0)
Alkaline Phosphatase: 37 IU/L — ABNORMAL LOW (ref 39–117)
BUN/Creatinine Ratio: 10 (ref 9–23)
BUN: 8 mg/dL (ref 6–20)
Bilirubin Total: <0.2 mg/dL (ref 0.0–1.2)
CO2: 23 mmol/L (ref 20–29)
Calcium: 9.6 mg/dL (ref 8.7–10.2)
Chloride: 104 mmol/L (ref 96–106)
Creatinine, Ser: 0.83 mg/dL (ref 0.57–1.00)
GFR calc Af Amer: 113 mL/min/{1.73_m2} (ref >59)
GFR calc non Af Amer: 98 mL/min/{1.73_m2} (ref >59)
Globulin, Total: 3 g/dL (ref 1.5–4.5)
Glucose: 87 mg/dL (ref 65–99)
Potassium: 4.4 mmol/L (ref 3.5–5.2)
Sodium: 141 mmol/L (ref 134–144)
Total Protein: 7.5 g/dL (ref 6.0–8.5)

## 2018-12-13 LAB — ALPHA-GAL PANEL
Alpha Gal IgE*: <0.1 kU/L (ref <0.10)
Beef (Bos spp) IgE: <0.1 kU/L (ref <0.35)
Class Interpretation: 0
Class Interpretation: 0
Class Interpretation: 0
Lamb/Mutton (Ovis spp) IgE: <0.1 kU/L (ref <0.35)
Pork (Sus spp) IgE: <0.1 kU/L (ref <0.35)

## 2018-12-13 LAB — CBC WITH DIFFERENTIAL/PLATELET
Basophils Absolute: 0.1 10*3/uL (ref 0.0–0.2)
Basos: 1 %
EOS (ABSOLUTE): 0.2 10*3/uL (ref 0.0–0.4)
Eos: 4 %
Hematocrit: 37.6 % (ref 34.0–46.6)
Hemoglobin: 12.6 g/dL (ref 11.1–15.9)
Immature Grans (Abs): 0 10*3/uL (ref 0.0–0.1)
Immature Granulocytes: 0 %
Lymphocytes Absolute: 2.2 10*3/uL (ref 0.7–3.1)
Lymphs: 39 %
MCH: 30.2 pg (ref 26.6–33.0)
MCHC: 33.5 g/dL (ref 31.5–35.7)
MCV: 90 fL (ref 79–97)
Monocytes Absolute: 0.5 10*3/uL (ref 0.1–0.9)
Monocytes: 9 %
Neutrophils Absolute: 2.6 10*3/uL (ref 1.4–7.0)
Neutrophils: 47 %
Platelets: 222 10*3/uL (ref 150–450)
RBC: 4.17 x10E6/uL (ref 3.77–5.28)
RDW: 12.3 % (ref 11.7–15.4)
WBC: 5.6 10*3/uL (ref 3.4–10.8)

## 2018-12-13 LAB — THYROID PEROXIDASE ANTIBODY: Thyroperoxidase Ab SerPl-aCnc: 38 IU/mL — ABNORMAL HIGH (ref 0–34)

## 2018-12-13 LAB — THYROGLOBULIN ANTIBODY: Thyroglobulin Antibody: <1 IU/mL (ref 0.0–0.9)

## 2018-12-13 LAB — CHRONIC URTICARIA: cu index: 5.2 (ref <10)

## 2018-12-13 LAB — ANA W/REFLEX IF POSITIVE: Anti Nuclear Antibody (ANA): NEGATIVE

## 2018-12-13 LAB — SEDIMENTATION RATE: Sed Rate: 10 mm/hr (ref 0–32)

## 2018-12-13 LAB — TRYPTASE: Tryptase: 5.1 ug/L (ref 2.2–13.2)

## 2019-01-20 ENCOUNTER — Other Ambulatory Visit: Payer: Self-pay | Admitting: Allergy and Immunology

## 2019-03-07 ENCOUNTER — Other Ambulatory Visit: Payer: Self-pay

## 2019-03-07 ENCOUNTER — Encounter: Payer: Self-pay | Admitting: Allergy and Immunology

## 2019-03-07 ENCOUNTER — Ambulatory Visit: Payer: BC Managed Care – PPO | Admitting: Allergy and Immunology

## 2019-03-07 VITALS — BP 112/72 | HR 66 | Temp 98.2°F | Resp 12

## 2019-03-07 DIAGNOSIS — J3089 Other allergic rhinitis: Secondary | ICD-10-CM | POA: Diagnosis not present

## 2019-03-07 DIAGNOSIS — J4530 Mild persistent asthma, uncomplicated: Secondary | ICD-10-CM

## 2019-03-07 DIAGNOSIS — H1013 Acute atopic conjunctivitis, bilateral: Secondary | ICD-10-CM | POA: Diagnosis not present

## 2019-03-07 DIAGNOSIS — L500 Allergic urticaria: Secondary | ICD-10-CM | POA: Diagnosis not present

## 2019-03-07 MED ORDER — MONTELUKAST SODIUM 10 MG PO TABS: 10.0000 mg | ORAL_TABLET | Freq: Every day | ORAL | 5 refills | Status: DC

## 2019-03-07 NOTE — Patient Instructions (Addendum)
Mild persistent asthma  Secondhand cigarette smoke should be strictly eliminated from the patients environment.  A prescription has been provided for montelukast 10 mg daily at bedtime.  The montelukast boxed warning has been discussed and the patient has verbalized understanding.  Continue albuterol HFA, 1 to 2 inhalations every 4-6 hours if needed.  If possible, take more frequent mask breaks at work.  Subjective and objective measures of pulmonary function will be followed and the treatment plan will be adjusted accordingly.  Recurrent urticaria  Continue H1/H2 receptor blockade, titrating to the lowest effective dose necessary to suppress urticaria.  Perennial and seasonal allergic rhinitis  Continue appropriate aeroallergen avoidance measures and levocetirizine 5 mg daily as needed.  Montelukast has been prescribed (as above).  If needed, add fluticasone nasal spray, 2 sprays per nostril daily as needed.  Nasal saline spray (i.e., Simply Saline) or nasal saline lavage (i.e., NeilMed) is recommended as needed and prior to medicated nasal sprays.  If allergen avoidance measures and medications fail to adequately relieve symptoms, aeroallergen immunotherapy will be considered.  Allergic conjunctivitis  Continue olopatadine eyedrops as needed.   Return in about 3 months (around 06/06/2019), or if symptoms worsen or fail to improve.

## 2019-03-07 NOTE — Assessment & Plan Note (Signed)
   Continue appropriate aeroallergen avoidance measures and levocetirizine 5 mg daily as needed.  Montelukast has been prescribed (as above).  If needed, add fluticasone nasal spray, 2 sprays per nostril daily as needed.  Nasal saline spray (i.e., Simply Saline) or nasal saline lavage (i.e., NeilMed) is recommended as needed and prior to medicated nasal sprays.  If allergen avoidance measures and medications fail to adequately relieve symptoms, aeroallergen immunotherapy will be considered.

## 2019-03-07 NOTE — Assessment & Plan Note (Addendum)
   Secondhand cigarette smoke should be strictly eliminated from the patients environment.  A prescription has been provided for montelukast 10 mg daily at bedtime.  The montelukast boxed warning has been discussed and the patient has verbalized understanding.  Continue albuterol HFA, 1 to 2 inhalations every 4-6 hours if needed.  If possible, take more frequent mask breaks at work.  Subjective and objective measures of pulmonary function will be followed and the treatment plan will be adjusted accordingly.

## 2019-03-07 NOTE — Assessment & Plan Note (Signed)
   Continue H1/H2 receptor blockade, titrating to the lowest effective dose necessary to suppress urticaria. 

## 2019-03-07 NOTE — Progress Notes (Signed)
Follow-up Note  RE: Janet Spencer MRN: [id number] DOB: 11-29-1990 Date of Office Visit: 03/07/2019  Primary care provider: Orvis Brill, MD Referring provider: Orvis Brill, MD  History of present illness: Janet Spencer is a 28 y.o. female with asthma, allergic rhinoconjunctivitis, and history of recurrent urticaria presenting today for follow-up.  She was previously seen in this clinic for her initial evaluation on November 29, 2018.  She reports that recently she has been experiencing "increased difficulty breathing", particularly while at work when she is busy and when she is on the phone at work.  She believes that wearing a protective mask "all the time" at work is responsible for the increased dyspnea.  She takes albuterol which provides benefit most of the time.  She reports that her urticaria was completely suppressed while taking levocetirizine twice a day and famotidine twice a day.  However, recently she has been less consistent with her antihistamines and notes that she has been experiencing slight itching but no urticarial lesions.  She reports that her nasal and ocular allergy symptoms are well controlled.  Assessment and plan: Mild persistent asthma  Secondhand cigarette smoke should be strictly eliminated from the patients environment.  A prescription has been provided for montelukast 10 mg daily at bedtime.  The montelukast boxed warning has been discussed and the patient has verbalized understanding.  Continue albuterol HFA, 1 to 2 inhalations every 4-6 hours if needed.  If possible, take more frequent mask breaks at work.  Subjective and objective measures of pulmonary function will be followed and the treatment plan will be adjusted accordingly.  Recurrent urticaria  Continue H1/H2 receptor blockade, titrating to the lowest effective dose necessary to suppress urticaria.  Perennial and seasonal allergic rhinitis  Continue appropriate aeroallergen avoidance  measures and levocetirizine 5 mg daily as needed.  Montelukast has been prescribed (as above).  If needed, add fluticasone nasal spray, 2 sprays per nostril daily as needed.  Nasal saline spray (i.e., Simply Saline) or nasal saline lavage (i.e., NeilMed) is recommended as needed and prior to medicated nasal sprays.  If allergen avoidance measures and medications fail to adequately relieve symptoms, aeroallergen immunotherapy will be considered.  Allergic conjunctivitis  Continue olopatadine eyedrops as needed.   Meds ordered this encounter  Medications  . montelukast (SINGULAIR) 10 MG tablet    Sig: Take 1 tablet (10 mg total) by mouth at bedtime.    Dispense:  30 tablet    Refill:  5    Diagnostics: Spirometry reveals an FVC of 3.17 L and an FEV1 of 2.94 L (80% predicted) without significant postbronchodilator improvement.  Please see scanned spirometry results for details.    Physical examination: Blood pressure 112/72, pulse 66, temperature 98.2 F (36.8 C), temperature source Oral, resp. rate 12, SpO2 98 %.  General: Alert, interactive, in no acute distress. HEENT: TMs pearly gray, turbinates mildly edematous without discharge, post-pharynx unremarkable. Neck: Supple without lymphadenopathy. Lungs: Clear to auscultation without wheezing, rhonchi or rales. CV: Normal S1, S2 without murmurs. Skin: Warm and dry, without lesions or rashes.  The following portions of the patient's history were reviewed and updated as appropriate: allergies, current medications, past family history, past medical history, past social history, past surgical history and problem list.  Current Outpatient Medications  Medication Sig Dispense Refill  . albuterol (VENTOLIN HFA) 108 (90 Base) MCG/ACT inhaler INHALE 2 PUFFS INTO THE LUNGS EVERY 4 (FOUR) HOURS AS NEEDED FOR WHEEZING OR SHORTNESS OF BREATH. 8 g 1  .  Bepotastine Besilate (BEPREVE) 1.5 % SOLN PLACE 1 DROP IN INTO BOTH EYES TWICE A DAY AS  NEEDED FOR ALLERGIES    . famotidine (PEPCID) 20 MG tablet 1 tablet twice daily. 64 tablet 5  . levocetirizine (XYZAL) 5 MG tablet Take 1 tablet (5 mg total) by mouth daily as needed for allergies. One tablet twice daily as needed for itching 30 tablet 5  . Levonorgestrel-Ethinyl Estradiol (AMETHIA) 0.1-0.02 & 0.01 MG tablet Take by mouth.    . fluticasone (FLONASE) 50 MCG/ACT nasal spray 2 sprays per nostril daily as needed for stuffy nose. (Patient not taking: Reported on 03/07/2019) 16 g 5  . montelukast (SINGULAIR) 10 MG tablet Take 1 tablet (10 mg total) by mouth at bedtime. 30 tablet 5   No current facility-administered medications for this visit.     Allergies  Allergen Reactions  . Penicillins    Review of systems: Review of systems negative except as noted in HPI / PMHx or noted below: Constitutional: Negative.  HENT: Negative.   Eyes: Negative.  Respiratory: Negative.   Cardiovascular: Negative.  Gastrointestinal: Negative.  Genitourinary: Negative.  Musculoskeletal: Negative.  Neurological: Negative.  Endo/Heme/Allergies: Negative.  Cutaneous: Negative.  Past Medical History:  Diagnosis Date  . ADD (attention deficit disorder)   . Urticaria     Family History  Problem Relation Age of Onset  . Allergic rhinitis Maternal Grandmother   . Angioedema Neg Hx   . Asthma Neg Hx   . Eczema Neg Hx   . Immunodeficiency Neg Hx   . Urticaria Neg Hx     Social History   Socioeconomic History  . Marital status: Single    Spouse name: Not on file  . Number of children: Not on file  . Years of education: Not on file  . Highest education level: Not on file  Occupational History  . Not on file  Social Needs  . Financial resource strain: Not on file  . Food insecurity    Worry: Not on file    Inability: Not on file  . Transportation needs    Medical: Not on file    Non-medical: Not on file  Tobacco Use  . Smoking status: Never Smoker  . Smokeless tobacco: Never  Used  Substance and Sexual Activity  . Alcohol use: No  . Drug use: No  . Sexual activity: Not on file  Lifestyle  . Physical activity    Days per week: Not on file    Minutes per session: Not on file  . Stress: Not on file  Relationships  . Social Musician on phone: Not on file    Gets together: Not on file    Attends religious service: Not on file    Active member of club or organization: Not on file    Attends meetings of clubs or organizations: Not on file    Relationship status: Not on file  . Intimate partner violence    Fear of current or ex partner: Not on file    Emotionally abused: Not on file    Physically abused: Not on file    Forced sexual activity: Not on file  Other Topics Concern  . Not on file  Social History Narrative  . Not on file    I appreciate the opportunity to take part in Luan's care. Please do not hesitate to contact me with questions.  Sincerely,   R. Jorene Guest, MD

## 2019-03-07 NOTE — Assessment & Plan Note (Signed)
   Continue olopatadine eyedrops as needed.

## 2019-03-13 ENCOUNTER — Telehealth: Payer: Self-pay | Admitting: *Deleted

## 2019-03-13 ENCOUNTER — Telehealth: Payer: Self-pay | Admitting: Allergy and Immunology

## 2019-03-13 NOTE — Telephone Encounter (Signed)
Progress notes from 03/07/19 faxed via Epic.

## 2019-03-13 NOTE — Telephone Encounter (Signed)
Mailed the frequent mask break letter to patient.

## 2019-03-13 NOTE — Telephone Encounter (Signed)
[place] dermatology rep asked for chart notes to be faxed to [phone]

## 2019-03-26 ENCOUNTER — Other Ambulatory Visit: Payer: Self-pay | Admitting: *Deleted

## 2019-03-26 MED ORDER — ALBUTEROL SULFATE HFA 108 (90 BASE) MCG/ACT IN AERS: 2.0000 | INHALATION_SPRAY | RESPIRATORY_TRACT | 2 refills | Status: DC | PRN

## 2019-05-29 ENCOUNTER — Other Ambulatory Visit: Payer: Self-pay | Admitting: Allergy and Immunology

## 2023-09-07 ENCOUNTER — Other Ambulatory Visit: Payer: Self-pay

## 2023-09-07 MED ORDER — SODIUM FLUORIDE 1.1 % DT PSTE
PASTE | Freq: Two times a day (BID) | DENTAL | 4 refills | Status: DC
  Filled 2023-09-07: qty 100, 25d supply, fill #0

## 2023-10-06 ENCOUNTER — Encounter: Payer: Self-pay | Admitting: Family Medicine

## 2023-10-06 ENCOUNTER — Ambulatory Visit: Admitting: Family Medicine

## 2023-10-06 VITALS — BP 110/86 | HR 104 | Temp 97.8°F | Ht 68.0 in | Wt 190.6 lb

## 2023-10-06 DIAGNOSIS — E663 Overweight: Secondary | ICD-10-CM

## 2023-10-06 DIAGNOSIS — R7989 Other specified abnormal findings of blood chemistry: Secondary | ICD-10-CM

## 2023-10-06 DIAGNOSIS — J3089 Other allergic rhinitis: Secondary | ICD-10-CM | POA: Diagnosis not present

## 2023-10-06 DIAGNOSIS — R600 Localized edema: Secondary | ICD-10-CM | POA: Diagnosis not present

## 2023-10-06 DIAGNOSIS — Z136 Encounter for screening for cardiovascular disorders: Secondary | ICD-10-CM

## 2023-10-06 DIAGNOSIS — L500 Allergic urticaria: Secondary | ICD-10-CM | POA: Diagnosis not present

## 2023-10-06 DIAGNOSIS — Z6828 Body mass index (BMI) 28.0-28.9, adult: Secondary | ICD-10-CM | POA: Diagnosis not present

## 2023-10-06 DIAGNOSIS — J4530 Mild persistent asthma, uncomplicated: Secondary | ICD-10-CM

## 2023-10-06 NOTE — Assessment & Plan Note (Signed)
 Labs today

## 2023-10-06 NOTE — Assessment & Plan Note (Signed)
 Stable, continue antihistamines

## 2023-10-06 NOTE — Assessment & Plan Note (Signed)
 Discussed reading [employer], [employer]

## 2023-10-06 NOTE — Patient Instructions (Addendum)
 Welcome to [facility]!  Thank you for choosing us  for your Primary Care needs.   We offer in person and video appointments for your convenience.   You may call our office to schedule appointments, or you may schedule appointments with me through MyChart.   The best way to get in contact with me is via MyChart message.   This will get to me faster than a phone call, unless there is an emergency, then please call 911.   The lab is located downstairs in the Sports Medicine building, we also have xray available there.   [facility] [employer] for shoe inserts and foot measurements.   We are checking labs today, will be in contact with any results that require further attention  [employer]  [facility] [facility]

## 2023-10-06 NOTE — Progress Notes (Signed)
 New Patient Office Visit  Subjective    Patient ID: Janet Spencer, female    DOB: 08-Aug-1990  Age: 33 y.o. MRN: [id number]  CC:  Chief Complaint  Patient presents with   Establish Care    Discuss checking TSH, feels pins and needles in bilateral heels, ongoing for about a year. Had plantar fasciitis about 2 years ago. Legs/Hands intermittently swell, denies pain    HPI Janet Spencer presents to establish care today. Reports bilateral lower extremity swelling as well as hand swelling.  States this had been going on for years.  Does wear compression socks, does have a job that she is on her feet. Has never had cardiac workup for this, does report personal history of ventral septal defect that she outgrew.  Did not have to have surgical repair. Reports pins and needle feeling to the soles of her feet at times, last about 5 minutes or so.  Has history of plantar fasciitis. Inquiring about healthy ways to lose weight. Does walk her dog 2 miles today. Reports compliance with medication regimen.  Denies other concerns today. Medical history as outlined below.  Outpatient Encounter Medications as of 10/06/2023  Medication Sig   albuterol  (VENTOLIN  HFA) 108 (90 Base) MCG/ACT inhaler Inhale 2 puffs into the lungs every 4 (four) hours as needed for wheezing or shortness of breath.   fluticasone  (FLONASE ) 50 MCG/ACT nasal spray 2 SPRAYS PER NOSTRIL DAILY AS NEEDED FOR STUFFY NOSE.   levocetirizine (XYZAL ) 5 MG tablet Take 1 tablet (5 mg total) by mouth daily as needed for allergies. One tablet twice daily as needed for itching   Sodium Fluoride  1.1 % PSTE BRUSH WITH PEA-SIZED AMOUNT 2 (two) TIME A DAY AND SPIT. DO NOT EAT OR DRINK FOR 30 MINUTES AFTER.   Levonorgestrel-Ethinyl Estradiol (AMETHIA) 0.1-0.02 & 0.01 MG tablet Take by mouth. (Patient not taking: Reported on 10/06/2023)   [DISCONTINUED] Bepotastine Besilate (BEPREVE) 1.5 % SOLN PLACE 1 DROP IN INTO BOTH EYES TWICE A DAY AS NEEDED FOR  ALLERGIES (Patient not taking: Reported on 10/06/2023)   [DISCONTINUED] famotidine  (PEPCID ) 20 MG tablet 1 tablet twice daily.   [DISCONTINUED] montelukast  (SINGULAIR ) 10 MG tablet Take 1 tablet (10 mg total) by mouth at bedtime.   No facility-administered encounter medications on file as of 10/06/2023.    Past Medical History:  Diagnosis Date   ADD (attention deficit disorder)    Allergy    Asthma    Urticaria     History reviewed. No pertinent surgical history.  Family History  Problem Relation Age of Onset   ADD / ADHD Brother    Obesity Brother    Allergic rhinitis Maternal Grandmother    Diabetes Maternal Grandmother    Hearing loss Maternal Grandmother    Hyperlipidemia Maternal Grandmother    Hypertension Maternal Grandmother    Kidney disease Maternal Grandmother    Learning disabilities Brother    Angioedema Neg Hx    Asthma Neg Hx    Eczema Neg Hx    Immunodeficiency Neg Hx    Urticaria Neg Hx     Social History   Socioeconomic History   Marital status: Single    Spouse name: Not on file   Number of children: Not on file   Years of education: Not on file   Highest education level: 12th grade  Occupational History   Not on file  Tobacco Use   Smoking status: Never   Smokeless tobacco: Never  Vaping Use   Vaping  status: Never Used  Substance and Sexual Activity   Alcohol use: No   Drug use: No   Sexual activity: Not Currently    Birth control/protection: None  Other Topics Concern   Not on file  Social History Narrative   Not on file   Social Drivers of Health   Financial Resource Strain: Low Risk  (10/02/2023)   Overall Financial Resource Strain (CARDIA)    Difficulty of Paying Living Expenses: Not hard at all  Food Insecurity: No Food Insecurity (10/02/2023)   Hunger Vital Sign    Worried About Running Out of Food in the Last Year: Never true    Ran Out of Food in the Last Year: Never true  Transportation Needs: No Transportation Needs  (10/02/2023)   PRAPARE - Administrator, Civil Service (Medical): No    Lack of Transportation (Non-Medical): No  Physical Activity: Sufficiently Active (10/02/2023)   Exercise Vital Sign    Days of Exercise per Week: 4 days    Minutes of Exercise per Session: 60 min  Stress: No Stress Concern Present (10/02/2023)   [employer] of Occupational Health - Occupational Stress Questionnaire    Feeling of Stress : Not at all  Social Connections: Moderately Isolated (10/02/2023)   Social Connection and Isolation Panel [NHANES]    Frequency of Communication with Friends and Family: Three times a week    Frequency of Social Gatherings with Friends and Family: Three times a week    Attends Religious Services: More than 4 times per year    Active Member of Clubs or Organizations: No    Attends Engineer, structural: Not on file    Marital Status: Never married  Intimate Partner Violence: Not on file    ROS Per HPI      Objective    BP 110/86 (BP Location: Left Arm, Patient Position: Sitting)   Pulse (!) 104   Temp 97.8 F (36.6 C) (Temporal)   Ht 5\' 8"  (1.727 m)   Wt 190 lb 9.6 oz (86.5 kg)   SpO2 100%   BMI 28.98 kg/m   Physical Exam Vitals and nursing note reviewed.  Constitutional:      General: She is not in acute distress.    Appearance: Normal appearance.  HENT:     Head: Normocephalic and atraumatic.     Right Ear: External ear normal.     Left Ear: External ear normal.     Nose: Nose normal.     Mouth/Throat:     Mouth: Mucous membranes are moist.     Pharynx: Oropharynx is clear.  Eyes:     Extraocular Movements: Extraocular movements intact.     Pupils: Pupils are equal, round, and reactive to light.  Cardiovascular:     Rate and Rhythm: Normal rate and regular rhythm.     Pulses: Normal pulses.     Heart sounds: Normal heart sounds.  Pulmonary:     Effort: Pulmonary effort is normal. No respiratory distress.     Breath sounds: Normal  breath sounds. No wheezing, rhonchi or rales.  Musculoskeletal:        General: Normal range of motion.     Cervical back: Normal range of motion.     Right lower leg: Edema present.     Left lower leg: Edema present.     Comments: +2 edema, nonpitting  Lymphadenopathy:     Cervical: No cervical adenopathy.  Skin:    General: Skin is warm  and dry.     Capillary Refill: Capillary refill takes less than 2 seconds.  Neurological:     General: No focal deficit present.     Mental Status: She is alert and oriented to person, place, and time.  Psychiatric:        Mood and Affect: Mood normal.        Thought Content: Thought content normal.        Assessment & Plan:   Mild persistent asthma, unspecified whether complicated Assessment & Plan: Stable, continue inhaler as needed  Orders: -     CBC with Differential/Platelet -     Comprehensive metabolic panel with GFR  Perennial and seasonal allergic rhinitis Assessment & Plan: Stable, continue antihistamines  Orders: -     CBC with Differential/Platelet -     Comprehensive metabolic panel with GFR  Recurrent urticaria -     CBC with Differential/Platelet -     Comprehensive metabolic panel with GFR -     Vitamin B12 -     VITAMIN D 25 Hydroxy (Vit-D Deficiency, Fractures)  Abnormal thyroid  screen (blood) -     Thyroid  Profile -     Thyroid  peroxidase antibody -     Vitamin B12 -     VITAMIN D 25 Hydroxy (Vit-D Deficiency, Fractures)  Encounter for screening for cardiovascular disorders Assessment & Plan: Lipids today  Orders: -     Lipid panel  Localized edema Assessment & Plan: Labs today  Orders: -     Brain natriuretic peptide  Overweight with body mass index (BMI) of 28 to 28.9 in adult Assessment & Plan: Discussed reading [employer], [employer]  Orders: -     CBC with Differential/Platelet -     Comprehensive metabolic panel with GFR  Discussed to wear compression socks, will check BNP  today  Return if symptoms worsen or fail to improve.   Wellington Half, FNP

## 2023-10-06 NOTE — Assessment & Plan Note (Signed)
 Stable, continue inhaler as needed

## 2023-10-06 NOTE — Assessment & Plan Note (Signed)
Lipids today

## 2023-10-07 LAB — LIPID PANEL
Cholesterol: 167 mg/dL (ref 0–200)
HDL: 59.2 mg/dL (ref >39.00)
LDL Cholesterol: 96 mg/dL (ref 0–99)
NonHDL: 107.6
Total CHOL/HDL Ratio: 3
Triglycerides: 58 mg/dL (ref 0.0–149.0)
VLDL: 11.6 mg/dL (ref 0.0–40.0)

## 2023-10-07 LAB — CBC WITH DIFFERENTIAL/PLATELET
Basophils Absolute: 0.1 10*3/uL (ref 0.0–0.1)
Basophils Relative: 1 % (ref 0.0–3.0)
Eosinophils Absolute: 0 10*3/uL (ref 0.0–0.7)
Eosinophils Relative: 0.7 % (ref 0.0–5.0)
HCT: 36.6 % (ref 36.0–46.0)
Hemoglobin: 12.4 g/dL (ref 12.0–15.0)
Lymphocytes Relative: 24 % (ref 12.0–46.0)
Lymphs Abs: 1.4 10*3/uL (ref 0.7–4.0)
MCHC: 33.9 g/dL (ref 30.0–36.0)
MCV: 90.8 fl (ref 78.0–100.0)
Monocytes Absolute: 0.6 10*3/uL (ref 0.1–1.0)
Monocytes Relative: 9.8 % (ref 3.0–12.0)
Neutro Abs: 3.8 10*3/uL (ref 1.4–7.7)
Neutrophils Relative %: 64.5 % (ref 43.0–77.0)
Platelets: 213 10*3/uL (ref 150.0–400.0)
RBC: 4.03 Mil/uL (ref 3.87–5.11)
RDW: 12.9 % (ref 11.5–15.5)
WBC: 5.8 10*3/uL (ref 4.0–10.5)

## 2023-10-07 LAB — COMPREHENSIVE METABOLIC PANEL WITH GFR
ALT: 10 U/L (ref 0–35)
AST: 15 U/L (ref 0–37)
Albumin: 4.2 g/dL (ref 3.5–5.2)
Alkaline Phosphatase: 40 U/L (ref 39–117)
BUN: 12 mg/dL (ref 6–23)
CO2: 28 meq/L (ref 19–32)
Calcium: 9 mg/dL (ref 8.4–10.5)
Chloride: 103 meq/L (ref 96–112)
Creatinine, Ser: 0.76 mg/dL (ref 0.40–1.20)
GFR: 105.03 mL/min (ref >60.00)
Glucose, Bld: 86 mg/dL (ref 70–99)
Potassium: 3.8 meq/L (ref 3.5–5.1)
Sodium: 138 meq/L (ref 135–145)
Total Bilirubin: 0.4 mg/dL (ref 0.2–1.2)
Total Protein: 7.3 g/dL (ref 6.0–8.3)

## 2023-10-07 LAB — THYROID PANEL
Free Thyroxine Index: 2.6 (ref 1.2–4.9)
T3 Uptake Ratio: 27 % (ref 24–39)
T4, Total: 9.6 ug/dL (ref 4.5–12.0)

## 2023-10-07 LAB — VITAMIN D 25 HYDROXY (VIT D DEFICIENCY, FRACTURES): VITD: 17.82 ng/mL — ABNORMAL LOW (ref 30.00–100.00)

## 2023-10-07 LAB — THYROID PEROXIDASE ANTIBODY: Thyroperoxidase Ab SerPl-aCnc: 1 [IU]/mL (ref <9)

## 2023-10-07 LAB — VITAMIN B12: Vitamin B-12: 223 pg/mL (ref 211–911)

## 2023-10-07 LAB — BRAIN NATRIURETIC PEPTIDE: Pro B Natriuretic peptide (BNP): 13 pg/mL (ref 0.0–100.0)

## 2023-10-12 ENCOUNTER — Encounter: Payer: Self-pay | Admitting: Family Medicine

## 2023-10-12 DIAGNOSIS — E559 Vitamin D deficiency, unspecified: Secondary | ICD-10-CM

## 2023-10-17 ENCOUNTER — Other Ambulatory Visit: Payer: Self-pay

## 2023-10-17 DIAGNOSIS — E559 Vitamin D deficiency, unspecified: Secondary | ICD-10-CM

## 2023-10-17 MED ORDER — VITAMIN D (ERGOCALCIFEROL) 1.25 MG (50000 UNIT) PO CAPS
50000.0000 [IU] | ORAL_CAPSULE | ORAL | 0 refills | Status: DC
Start: 2023-10-17 — End: 2023-10-17

## 2023-10-17 MED ORDER — VITAMIN D (ERGOCALCIFEROL) 1.25 MG (50000 UNIT) PO CAPS
50000.0000 [IU] | ORAL_CAPSULE | ORAL | 0 refills | Status: AC
  Filled 2023-10-17: qty 8, 56d supply, fill #0

## 2023-10-18 ENCOUNTER — Other Ambulatory Visit: Payer: Self-pay

## 2023-11-07 ENCOUNTER — Encounter: Payer: Self-pay | Admitting: Family Medicine

## 2023-11-10 NOTE — Progress Notes (Unsigned)
 Virtual Visit via Video Note  I connected with Janet Spencer on 11/11/23 at  4:20 PM EDT by a video enabled telemedicine application and verified that I am speaking with the correct person using two identifiers.  Patient Location: Home Provider Location: Office/Clinic  I discussed the limitations, risks, security, and privacy concerns of performing an evaluation and management service by video and the availability of in person appointments. I also discussed with the patient that there may be a patient responsible charge related to this service. The patient expressed understanding and agreed to proceed.  Subjective: PCP: Janet Half, FNP  Chief Complaint  Patient presents with   Rash    Lips have been super chapped for over a month, has tried Aloe, Aquaphor, & Vaseline. Corners of lips are cracking open, thought it was sunburn initially.   HPI 33 year old female presents for A/V visit for evaluation of possible sunburn to her lips for the last month. Reports that she has been using aloe, Aquaphor and Vaseline. States that the corners of her lips are cracking open when she wakes up in the mornings, states this is pretty painful. Denies previous symptoms. Denies other rashes, new exposures.  Separately inquiring about gynecologist for Pap smear.  She was previously seen at [facility], and would like referral to a [facility] provider today.  Denies other concerns today.  ROS: Per HPI  Current Outpatient Medications:    triamcinolone cream (KENALOG) 0.1 %, Apply 1 Application topically 2 (two) times daily., Disp: 30 g, Rfl: 0   albuterol  (VENTOLIN  HFA) 108 (90 Base) MCG/ACT inhaler, Inhale 2 puffs into the lungs every 4 (four) hours as needed for wheezing or shortness of breath., Disp: 8 g, Rfl: 2   fluticasone  (FLONASE ) 50 MCG/ACT nasal spray, 2 SPRAYS PER NOSTRIL DAILY AS NEEDED FOR STUFFY NOSE., Disp: 16 g, Rfl: 0   levocetirizine (XYZAL ) 5 MG tablet, Take 1 tablet (5  mg total) by mouth daily as needed for allergies. One tablet twice daily as needed for itching, Disp: 30 tablet, Rfl: 5   Sodium Fluoride  1.1 % PSTE, BRUSH WITH PEA-SIZED AMOUNT 2 (two) TIME A DAY AND SPIT. DO NOT EAT OR DRINK FOR 30 MINUTES AFTER., Disp: 100 mL, Rfl: 4   Vitamin D , Ergocalciferol , (DRISDOL ) 1.25 MG (50000 UNIT) CAPS capsule, Take 1 capsule (50,000 Units total) by mouth every 7 (seven) days., Disp: 8 capsule, Rfl: 0  Observations/Objective: There were no vitals filed for this visit. Physical Exam Vitals and nursing note reviewed.  Constitutional:      General: She is not in acute distress. HENT:     Head: Normocephalic and atraumatic.     Mouth/Throat:      Comments: Area of erythema and peeling skin to the border of the upper and lower lip.  No bleeding, no discharge Eyes:     Extraocular Movements: Extraocular movements intact.  Pulmonary:     Effort: Pulmonary effort is normal.  Musculoskeletal:     Cervical back: Normal range of motion.  Neurological:     General: No focal deficit present.     Mental Status: She is alert and oriented to person, place, and time.  Psychiatric:        Mood and Affect: Mood normal.        Behavior: Behavior normal.    Assessment and Plan: Rash -     Triamcinolone Acetonide; Apply 1 Application topically 2 (two) times daily.  Dispense: 30 g; Refill: 0  Cheilitis -  Triamcinolone Acetonide; Apply 1 Application topically 2 (two) times daily.  Dispense: 30 g; Refill: 0  Cervical cancer screening -     Ambulatory referral to Obstetrics / Gynecology    Follow Up Instructions: Return for as scheduled for CPE.   I discussed the assessment and treatment plan with the patient. The patient was provided an opportunity to ask questions, and all were answered. The patient agreed with the plan and demonstrated an understanding of the instructions.   The patient was advised to call back or seek an in-person evaluation if the symptoms  worsen or if the condition fails to improve as anticipated.  I spent 14 minutes on this encounter.  The above assessment and management plan was discussed with the patient. The patient verbalized understanding of and has agreed to the management plan.   Janet Half, FNP

## 2023-11-11 ENCOUNTER — Other Ambulatory Visit: Payer: Self-pay

## 2023-11-11 ENCOUNTER — Encounter: Payer: Self-pay | Admitting: Family Medicine

## 2023-11-11 ENCOUNTER — Telehealth: Admitting: Family Medicine

## 2023-11-11 DIAGNOSIS — K130 Diseases of lips: Secondary | ICD-10-CM | POA: Diagnosis not present

## 2023-11-11 DIAGNOSIS — R21 Rash and other nonspecific skin eruption: Secondary | ICD-10-CM | POA: Diagnosis not present

## 2023-11-11 DIAGNOSIS — Z124 Encounter for screening for malignant neoplasm of cervix: Secondary | ICD-10-CM

## 2023-11-11 MED ORDER — TRIAMCINOLONE ACETONIDE 0.1 % EX CREA
1.0000 | TOPICAL_CREAM | Freq: Two times a day (BID) | CUTANEOUS | 0 refills | Status: AC
  Filled 2023-11-11: qty 30, 15d supply, fill #0

## 2023-11-11 NOTE — Patient Instructions (Addendum)
 I have sent in triamcinolone cream for you.  You may use this twice a day.  May continue vaseline/aquaphor to the area  Follow up with me for your physical as scheduled.

## 2023-12-16 ENCOUNTER — Encounter: Payer: Self-pay | Admitting: Family Medicine

## 2023-12-16 ENCOUNTER — Ambulatory Visit: Admitting: Family Medicine

## 2023-12-16 VITALS — BP 118/62 | HR 63 | Temp 98.2°F | Ht 68.0 in | Wt 189.0 lb

## 2023-12-16 DIAGNOSIS — M7989 Other specified soft tissue disorders: Secondary | ICD-10-CM

## 2023-12-16 DIAGNOSIS — K130 Diseases of lips: Secondary | ICD-10-CM

## 2023-12-16 DIAGNOSIS — J4530 Mild persistent asthma, uncomplicated: Secondary | ICD-10-CM

## 2023-12-16 DIAGNOSIS — Z8279 Family history of other congenital malformations, deformations and chromosomal abnormalities: Secondary | ICD-10-CM | POA: Diagnosis not present

## 2023-12-16 DIAGNOSIS — Z0000 Encounter for general adult medical examination without abnormal findings: Secondary | ICD-10-CM

## 2023-12-16 DIAGNOSIS — Z23 Encounter for immunization: Secondary | ICD-10-CM

## 2023-12-16 DIAGNOSIS — J3089 Other allergic rhinitis: Secondary | ICD-10-CM | POA: Diagnosis not present

## 2023-12-16 DIAGNOSIS — Z136 Encounter for screening for cardiovascular disorders: Secondary | ICD-10-CM

## 2023-12-16 NOTE — Progress Notes (Deleted)
   Acute Office Visit  Subjective:     Patient ID: Janet Spencer, female    DOB: Feb 21, 1991, 33 y.o.   MRN: [id number]  No chief complaint on file.   HPI  Discussed the use of AI scribe software for clinical note transcription with the patient, who gave verbal consent to proceed.  History of Present Illness      ROS Per HPI      Objective:    There were no vitals taken for this visit.   Physical Exam Vitals and nursing note reviewed.  Constitutional:      General: She is not in acute distress.    Appearance: Normal appearance. She is normal weight.  HENT:     Head: Normocephalic and atraumatic.     Right Ear: External ear normal.     Left Ear: External ear normal.     Nose: Nose normal.     Mouth/Throat:     Mouth: Mucous membranes are moist.     Pharynx: Oropharynx is clear.  Eyes:     Extraocular Movements: Extraocular movements intact.     Pupils: Pupils are equal, round, and reactive to light.  Cardiovascular:     Rate and Rhythm: Normal rate and regular rhythm.     Pulses: Normal pulses.     Heart sounds: Normal heart sounds.  Pulmonary:     Effort: Pulmonary effort is normal. No respiratory distress.     Breath sounds: Normal breath sounds. No wheezing, rhonchi or rales.  Abdominal:     General: Bowel sounds are normal. There is no distension.     Palpations: There is no mass.     Tenderness: There is no abdominal tenderness. There is no guarding or rebound.     Hernia: No hernia is present.  Musculoskeletal:        General: Normal range of motion.     Cervical back: Normal range of motion.     Right lower leg: No edema.     Left lower leg: No edema.  Lymphadenopathy:     Cervical: No cervical adenopathy.  Skin:    General: Skin is warm and dry.  Neurological:     General: No focal deficit present.     Mental Status: She is alert and oriented to person, place, and time.  Psychiatric:        Mood and Affect: Mood normal.        Thought  Content: Thought content normal.     No results found for any visits on 12/16/23.      Assessment & Plan:   Assessment and Plan Assessment & Plan       No orders of the defined types were placed in this encounter.   No follow-ups on file.  Corean LITTIE Ku, FNP

## 2023-12-16 NOTE — Progress Notes (Signed)
 Established Patient Office Visit  Subjective   Patient ID: Janet Spencer, female    DOB: [birth year]  Age: 33 y.o. MRN: [id number]  Chief Complaint  Patient presents with   Annual Exam    Patient here for physical no other concerns    HPI  Patient here today for annual physical examination. She reports feeling well. Patient complaining of persistent lower extremity edema for which she would like additional work-up.   Dermatology - requesting dermatology referral Dentist - last dental exam 09/2023 Eye exam - last exam 09/2023 Always wears seat belt in front and rear seats Diet - consists of processed foods, but has begun cooking healthier meals. States she does not drink enough water Physical activity - has an active job and walks 2 miles/day 3-4 times/week Caffeine - drinks 2 caffeinated beverages per day Vaccines - reports she is current on HBV and tetanus. States she may be interested in obtaining pneumonia vaccine today Pap smear - last Pap completed at least 3 years ago. Awaiting call from gyn to schedule appt Denies drug use, alcohol consumption, tobacco use, vaping/e-cigarette use    Review of Systems  Cardiovascular:  Positive for leg swelling.  All other systems reviewed and are negative.     Objective:     BP 118/62 (BP Location: Left Arm, Patient Position: Sitting, Cuff Size: Normal)   Pulse 63   Temp 98.2 F (36.8 C) (Oral)   Ht 5' 8 (1.727 m)   Wt 189 lb (85.7 kg)   SpO2 99%   BMI 28.74 kg/m  BP Readings from Last 3 Encounters:  12/16/23 118/62  10/06/23 110/86  03/07/19 112/72   Wt Readings from Last 3 Encounters:  12/16/23 189 lb (85.7 kg)  10/06/23 190 lb 9.6 oz (86.5 kg)  11/29/18 156 lb 12 oz (71.1 kg)      Physical Exam Vitals and nursing note reviewed.  Constitutional:      Appearance: Normal appearance.  HENT:     Head: Normocephalic and atraumatic.     Right Ear: Tympanic membrane normal.     Left Ear: Tympanic membrane normal.      Nose: Nose normal.     Mouth/Throat:     Mouth: Mucous membranes are moist.     Pharynx: Oropharynx is clear.  Eyes:     Extraocular Movements: Extraocular movements intact.     Conjunctiva/sclera: Conjunctivae normal.     Pupils: Pupils are equal, round, and reactive to light.  Cardiovascular:     Rate and Rhythm: Normal rate and regular rhythm.     Pulses: Normal pulses.     Heart sounds: Normal heart sounds.  Pulmonary:     Effort: Pulmonary effort is normal.     Breath sounds: Normal breath sounds.  Musculoskeletal:        General: Normal range of motion.     Cervical back: Normal range of motion and neck supple.  Lymphadenopathy:     Cervical: No cervical adenopathy.  Skin:    General: Skin is warm and dry.     Capillary Refill: Capillary refill takes less than 2 seconds.  Neurological:     Mental Status: She is oriented to person, place, and time.  Psychiatric:        Mood and Affect: Mood normal.        Behavior: Behavior normal.    No results found for any visits on 12/16/23.  Last CBC Lab Results  Component Value Date   WBC 5.8  10/06/2023   HGB 12.4 10/06/2023   HCT 36.6 10/06/2023   MCV 90.8 10/06/2023   MCH 30.2 12/06/2018   RDW 12.9 10/06/2023   PLT 213.0 10/06/2023   Last metabolic panel Lab Results  Component Value Date   GLUCOSE 86 10/06/2023   NA 138 10/06/2023   K 3.8 10/06/2023   CL 103 10/06/2023   CO2 28 10/06/2023   BUN 12 10/06/2023   CREATININE 0.76 10/06/2023   GFR 105.03 10/06/2023   CALCIUM 9.0 10/06/2023   PROT 7.3 10/06/2023   ALBUMIN 4.2 10/06/2023   LABGLOB 3.0 12/06/2018   AGRATIO 1.5 12/06/2018   BILITOT 0.4 10/06/2023   ALKPHOS 40 10/06/2023   AST 15 10/06/2023   ALT 10 10/06/2023   Last lipids Lab Results  Component Value Date   CHOL 167 10/06/2023   HDL 59.20 10/06/2023   LDLCALC 96 10/06/2023   TRIG 58.0 10/06/2023   CHOLHDL 3 10/06/2023          Assessment & Plan:   Problem List Items Addressed This  Visit     Perennial and seasonal allergic rhinitis   Stable, continue antihistamines.      Mild persistent asthma   Stable, continue current inhalers Pneumococcal vaccine given today      Relevant Orders   Pneumococcal conjugate vaccine 20-valent (Prevnar 20) (Completed)   Encounter for screening for cardiovascular disorders   Lipids today      Cheilitis   Referral dermatology      Relevant Orders   Ambulatory referral to Dermatology   Well adult exam - Primary   Preventative care, screenings, vaccines discussed      Relevant Orders   Pneumococcal conjugate vaccine 20-valent (Prevnar 20) (Completed)   Family history of VSD (ventricular septal defect)   Referral cardiology      Relevant Orders   Ambulatory referral to Cardiology   Ambulatory referral to Dermatology   Leg swelling   Referral to cardiology      Relevant Orders   Ambulatory referral to Cardiology   Other Visit Diagnoses       Need for vaccination       Relevant Orders   Pneumococcal conjugate vaccine 20-valent (Prevnar 20) (Completed)       Return in about 1 year (around 12/15/2024) for CPE.   Debby CHRISTELLA Borer, RN Corean LITTIE Ku, FNP

## 2023-12-16 NOTE — Patient Instructions (Addendum)
 We have completed your physical today.  We have given your pneumonia vaccine today.  We have sent in referrals to cardiology and dermatology.  Someone from each office will be reaching out to get you scheduled.  Follow up with us  in a year for physical, sooner if needed

## 2023-12-26 NOTE — Assessment & Plan Note (Signed)
Lipids today

## 2023-12-26 NOTE — Assessment & Plan Note (Signed)
Referral cardiology

## 2023-12-26 NOTE — Assessment & Plan Note (Signed)
 Referral to cardiology

## 2023-12-26 NOTE — Assessment & Plan Note (Signed)
 Stable, continue current inhalers Pneumococcal vaccine given today

## 2023-12-26 NOTE — Assessment & Plan Note (Signed)
 Stable, continue antihistamines

## 2023-12-26 NOTE — Assessment & Plan Note (Signed)
 Preventative care, screenings, vaccines discussed

## 2023-12-26 NOTE — Assessment & Plan Note (Signed)
Referral dermatology

## 2024-01-02 DIAGNOSIS — F411 Generalized anxiety disorder: Secondary | ICD-10-CM | POA: Diagnosis not present

## 2024-01-02 DIAGNOSIS — Z5181 Encounter for therapeutic drug level monitoring: Secondary | ICD-10-CM | POA: Diagnosis not present

## 2024-01-02 DIAGNOSIS — F32A Depression, unspecified: Secondary | ICD-10-CM | POA: Diagnosis not present

## 2024-01-02 DIAGNOSIS — Z79899 Other long term (current) drug therapy: Secondary | ICD-10-CM | POA: Diagnosis not present

## 2024-01-02 DIAGNOSIS — F900 Attention-deficit hyperactivity disorder, predominantly inattentive type: Secondary | ICD-10-CM | POA: Diagnosis not present

## 2024-01-11 DIAGNOSIS — F411 Generalized anxiety disorder: Secondary | ICD-10-CM | POA: Diagnosis not present

## 2024-01-11 DIAGNOSIS — F900 Attention-deficit hyperactivity disorder, predominantly inattentive type: Secondary | ICD-10-CM | POA: Diagnosis not present

## 2024-01-11 DIAGNOSIS — F32A Depression, unspecified: Secondary | ICD-10-CM | POA: Diagnosis not present

## 2024-01-12 ENCOUNTER — Other Ambulatory Visit: Payer: Self-pay

## 2024-01-12 MED ORDER — METHYLPHENIDATE HCL ER (OSM) 18 MG PO TBCR
18.0000 mg | EXTENDED_RELEASE_TABLET | Freq: Every morning | ORAL | 0 refills | Status: DC
  Filled 2024-01-12: qty 30, 30d supply, fill #0

## 2024-01-31 DIAGNOSIS — L245 Irritant contact dermatitis due to other chemical products: Secondary | ICD-10-CM | POA: Diagnosis not present

## 2024-01-31 DIAGNOSIS — L700 Acne vulgaris: Secondary | ICD-10-CM | POA: Diagnosis not present

## 2024-01-31 DIAGNOSIS — D225 Melanocytic nevi of trunk: Secondary | ICD-10-CM | POA: Diagnosis not present

## 2024-01-31 DIAGNOSIS — L918 Other hypertrophic disorders of the skin: Secondary | ICD-10-CM | POA: Diagnosis not present

## 2024-02-10 ENCOUNTER — Ambulatory Visit: Admitting: Obstetrics and Gynecology

## 2024-02-10 ENCOUNTER — Other Ambulatory Visit: Payer: Self-pay

## 2024-02-10 ENCOUNTER — Encounter: Payer: Self-pay | Admitting: Obstetrics and Gynecology

## 2024-02-10 ENCOUNTER — Other Ambulatory Visit (HOSPITAL_COMMUNITY)
Admission: RE | Admit: 2024-02-10 | Discharge: 2024-02-10 | Disposition: A | Discharge status: Home / Self Care | Source: Ambulatory Visit | Attending: Obstetrics and Gynecology | Admitting: Obstetrics and Gynecology

## 2024-02-10 VITALS — BP 97/63 | HR 71 | Ht 68.0 in | Wt 185.0 lb

## 2024-02-10 DIAGNOSIS — Z01419 Encounter for gynecological examination (general) (routine) without abnormal findings: Secondary | ICD-10-CM | POA: Diagnosis not present

## 2024-02-10 DIAGNOSIS — N898 Other specified noninflammatory disorders of vagina: Secondary | ICD-10-CM | POA: Diagnosis not present

## 2024-02-10 DIAGNOSIS — Z124 Encounter for screening for malignant neoplasm of cervix: Secondary | ICD-10-CM

## 2024-02-10 DIAGNOSIS — N946 Dysmenorrhea, unspecified: Secondary | ICD-10-CM | POA: Diagnosis not present

## 2024-02-10 DIAGNOSIS — Z1331 Encounter for screening for depression: Secondary | ICD-10-CM | POA: Diagnosis not present

## 2024-02-10 MED ORDER — NAPROXEN 500 MG PO TABS
500.0000 mg | ORAL_TABLET | Freq: Two times a day (BID) | ORAL | 2 refills | Status: AC
  Filled 2024-02-10 – 2024-02-22 (×2): qty 60, 30d supply, fill #0

## 2024-02-10 NOTE — Progress Notes (Signed)
 ANNUAL GYNECOLOGY VISIT Chief Complaint  Patient presents with   Gynecologic Exam     Subjective:  Janet Spencer is a 33 y.o. G0P0000 who presents for annual exam  Reports painful periods. Previously on OCPs which managed her symptoms but wants nonhormonal options.  Reports vaginal discharge in between her periods, with smell. Had BV in past. Uses vaginal cleansers  Note UTIs after sex with previous partner   Gyn History: Patient's last menstrual period was 01/31/2024 (exact date). Sexually active: yes/no: Yes Contraception: no method, not currently active History of STIs: No Last pap: No results found for: DIAGPAP, HPV, HPVHIGH 3-4 years ago History of abnormal pap: No Periods: regular     The pregnancy intention screening data noted above was reviewed. Potential methods of contraception were discussed. The patient elected to proceed with No data recorded.       02/10/2024    9:18 AM 12/16/2023    3:47 PM  Depression screen PHQ 2/9  Decreased Interest 0 0  Down, Depressed, Hopeless 0 0  PHQ - 2 Score 0 0  Altered sleeping 0   Tired, decreased energy 0   Change in appetite 0   Feeling bad or failure about yourself  0   Trouble concentrating 0   Moving slowly or fidgety/restless 0   Suicidal thoughts 0   PHQ-9 Score 0         02/10/2024    9:18 AM  GAD 7 : Generalized Anxiety Score  Nervous, Anxious, on Edge 0  Control/stop worrying 0  Worry too much - different things 0  Trouble relaxing 0  Restless 0  Easily annoyed or irritable 0  Afraid - awful might happen 0  Total GAD 7 Score 0      OB History     Gravida  0   Para  0   Term  0   Preterm  0   AB  0   Living  0      SAB  0   IAB  0   Ectopic  0   Multiple  0   Live Births  0           Past Medical History:  Diagnosis Date   ADD (attention deficit disorder)    Allergy    Asthma    Urticaria     History reviewed. No pertinent surgical history.  Social  History   Socioeconomic History   Marital status: Single    Spouse name: Not on file   Number of children: Not on file   Years of education: Not on file   Highest education level: Some college, no degree  Occupational History   Not on file  Tobacco Use   Smoking status: Never   Smokeless tobacco: Never  Vaping Use   Vaping status: Never Used  Substance and Sexual Activity   Alcohol use: No   Drug use: No   Sexual activity: Not Currently    Birth control/protection: None  Other Topics Concern   Not on file  Social History Narrative   Not on file   Social Drivers of Health   Financial Resource Strain: Low Risk  (12/15/2023)   Overall Financial Resource Strain (CARDIA)    Difficulty of Paying Living Expenses: Not hard at all  Food Insecurity: No Food Insecurity (12/15/2023)   Hunger Vital Sign    Worried About Running Out of Food in the Last Year: Never true    Ran Out of Food  in the Last Year: Never true  Transportation Needs: No Transportation Needs (12/15/2023)   PRAPARE - Administrator, Civil Service (Medical): No    Lack of Transportation (Non-Medical): No  Physical Activity: Sufficiently Active (12/15/2023)   Exercise Vital Sign    Days of Exercise per Week: 5 days    Minutes of Exercise per Session: 90 min  Stress: No Stress Concern Present (12/15/2023)   [employer] of Occupational Health - Occupational Stress Questionnaire    Feeling of Stress: Only a little  Social Connections: Moderately Integrated (12/15/2023)   Social Connection and Isolation Panel    Frequency of Communication with Friends and Family: More than three times a week    Frequency of Social Gatherings with Friends and Family: Twice a week    Attends Religious Services: More than 4 times per year    Active Member of [employer] or Organizations: Yes    Attends Banker Meetings: More than 4 times per year    Marital Status: Never married  Recent Concern: Social Connections -  Moderately Isolated (10/02/2023)   Social Connection and Isolation Panel    Frequency of Communication with Friends and Family: Three times a week    Frequency of Social Gatherings with Friends and Family: Three times a week    Attends Religious Services: More than 4 times per year    Active Member of Clubs or Organizations: No    Attends Engineer, structural: Not on file    Marital Status: Never married    Family History  Problem Relation Age of Onset   ADD / ADHD Brother    Obesity Brother    Allergic rhinitis Maternal Grandmother    Diabetes Maternal Grandmother    Hearing loss Maternal Grandmother    Hyperlipidemia Maternal Grandmother    Hypertension Maternal Grandmother    Kidney disease Maternal Grandmother    Learning disabilities Brother    Angioedema Neg Hx    Asthma Neg Hx    Eczema Neg Hx    Immunodeficiency Neg Hx    Urticaria Neg Hx     Current Outpatient Medications on File Prior to Visit  Medication Sig Dispense Refill   albuterol  (VENTOLIN  HFA) 108 (90 Base) MCG/ACT inhaler Inhale 2 puffs into the lungs every 4 (four) hours as needed for wheezing or shortness of breath. 8 g 2   fluticasone  (FLONASE ) 50 MCG/ACT nasal spray 2 SPRAYS PER NOSTRIL DAILY AS NEEDED FOR STUFFY NOSE. 16 g 0   levocetirizine (XYZAL ) 5 MG tablet Take 1 tablet (5 mg total) by mouth daily as needed for allergies. One tablet twice daily as needed for itching 30 tablet 5   triamcinolone  cream (KENALOG ) 0.1 % Apply 1 Application topically 2 (two) times daily. 30 g 0   Vitamin D , Ergocalciferol , (DRISDOL ) 1.25 MG (50000 UNIT) CAPS capsule Take 1 capsule (50,000 Units total) by mouth every 7 (seven) days. 8 capsule 0   Sodium Fluoride  1.1 % PSTE BRUSH WITH PEA-SIZED AMOUNT 2 (two) TIME A DAY AND SPIT. DO NOT EAT OR DRINK FOR 30 MINUTES AFTER. (Patient not taking: Reported on 02/10/2024) 100 mL 4   No current facility-administered medications on file prior to visit.    Allergies  Allergen  Reactions   Penicillins      Objective:   Vitals:   02/10/24 0911  BP: 97/63  Pulse: 71  Weight: 185 lb (83.9 kg)  Height: 5' 8 (1.727 m)   Physical Examination:  General appearance - well appearing, and in no distress  Mental status - alert, oriented to person, place, and time  Psych:  normal mood and affect  Skin - warm and dry, normal color, no suspicious lesions noted  Breasts - breasts appear normal, no suspicious masses, no skin or nipple changes or axillary nodes  Abdomen - soft, nontender, nondistended, no masses or organomegaly  Pelvic -  VULVA: normal appearing vulva with no masses, tenderness or lesions  VAGINA: normal appearing vagina with normal color and discharge, no lesions   CERVIX: normal appearing cervix without discharge or lesions, no CMT  Thin prep pap is done with HR HPV cotesting  UTERUS: uterus is felt to be normal size, shape, consistency and nontender   ADNEXA: No adnexal masses or tenderness noted.  Extremities:  No swelling or varicosities noted  Chaperone present for exam  Assessment and Plan:  1. Well woman exam with routine gynecological exam (Primary) Pap/HPV Normal clinical breast exam, reviewed self breast exams, mammograms to start at age 35 Declines STI testing and contraception  2. Cervical cancer screening - Cytology - PAP( [place])  3. Dysmenorrhea Counseled on options, recommend naproxen . Rx sent.  - naproxen  (NAPROSYN ) 500 MG tablet; Take 1 tablet (500 mg total) by mouth 2 (two) times daily with a meal. Start 3 days before menses and continue through most painful days of menses  Dispense: 60 tablet; Refill: 2  4. Vaginal discharge Will test for vaginitis. Recommend against douching and any internal substances. Reviewed vulvovaginal hygiene - Cervicovaginal ancillary only   Future Appointments  Date Time Provider Department Center  03/01/2024 10:40 AM Revankar, Jennifer SAUNDERS, MD [facility] None  07/31/2024  4:00 PM Alm Delon SAILOR, DO [facility] None    Rollo ONEIDA Bring, MD, FACOG Obstetrician & Gynecologist, [facility], [facility]

## 2024-02-10 NOTE — Addendum Note (Signed)
 Addended by: Yoshiye Kraft T on: 02/10/2024 09:31 AM   Modules accepted: Level of Service

## 2024-02-13 ENCOUNTER — Ambulatory Visit: Payer: Self-pay | Admitting: Obstetrics and Gynecology

## 2024-02-13 LAB — CERVICOVAGINAL ANCILLARY ONLY
Bacterial Vaginitis (gardnerella): NEGATIVE
Candida Glabrata: NEGATIVE
Candida Vaginitis: NEGATIVE
Comment: NEGATIVE
Comment: NEGATIVE
Comment: NEGATIVE

## 2024-02-15 LAB — CYTOLOGY - PAP
Comment: NEGATIVE
Diagnosis: NEGATIVE
High risk HPV: NEGATIVE

## 2024-02-21 ENCOUNTER — Other Ambulatory Visit: Payer: Self-pay

## 2024-02-22 ENCOUNTER — Other Ambulatory Visit: Payer: Self-pay

## 2024-02-23 ENCOUNTER — Other Ambulatory Visit: Payer: Self-pay

## 2024-02-27 ENCOUNTER — Encounter: Payer: Self-pay | Admitting: Family Medicine

## 2024-02-28 ENCOUNTER — Other Ambulatory Visit: Payer: Self-pay

## 2024-02-28 MED ORDER — ALBUTEROL SULFATE HFA 108 (90 BASE) MCG/ACT IN AERS
2.0000 | INHALATION_SPRAY | RESPIRATORY_TRACT | 2 refills | Status: AC | PRN
  Filled 2024-02-28: qty 8.5, 17d supply, fill #0

## 2024-03-01 ENCOUNTER — Encounter: Admitting: Advanced Practice Midwife

## 2024-03-01 ENCOUNTER — Ambulatory Visit: Attending: Cardiology

## 2024-03-01 ENCOUNTER — Encounter: Payer: Self-pay | Admitting: *Deleted

## 2024-03-01 ENCOUNTER — Other Ambulatory Visit: Payer: Self-pay

## 2024-03-01 VITALS — BP 100/66 | HR 54 | Ht 68.0 in | Wt 187.0 lb

## 2024-03-01 DIAGNOSIS — Q210 Ventricular septal defect: Secondary | ICD-10-CM | POA: Insufficient documentation

## 2024-03-01 NOTE — Progress Notes (Signed)
 Cardiology Consultation:    Date:  03/01/2024   ID:  Janet Spencer, DOB May 12, 1991, MRN [id number]  PCP:  Janet Corean CROME, FNP  Cardiologist:  Janet SAUNDERS Raeonna Milo, MD   Referring MD: Janet Spencer, *   No chief complaint on file.    ASSESSMENT AND PLAN:   Janet Spencer 33 year old woman with history of mild persistent asthma, reportedly history of VSD as an infant, no further imaging or follow-up available. Now here for further evaluation.  Problem List Items Addressed This Visit     VSD (ventricular septal defect) - Primary   Reports history of VSD as a newborn.  Discussed pathophysiology of VSD and various types and degree of shunt.. Clinically no significant physical exam findings, no murmur. Will proceed with transthoracic echocardiogram for further evaluation. 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  Her lower extremity edema concerns not consistent with pitting pedal edema.  Likely more related to subcutaneous fat. BNP is normal. With no significant diastolic dysfunction on echocardiogram, no further cardiac workup is required. In the interim I recommended her to pursue low-salt diet.      Relevant Orders   EKG 12-Lead (Completed)   ECHOCARDIOGRAM COMPLETE   Return to clinic based on results of the echocardiogram   History of Present Illness:    Janet Spencer is a 33 y.o. female who is being seen today for the evaluation of cardiac disease in the setting of family history of ventricular septal defect at the request of Janet Spencer, *.  Has history of mild persistent asthma, reportedly history of VSD mention by her parents while she was an infant that closed up with age.  No prior records available to review.  No prior echocardiogram to review. She mentions no follow-up with cardiologist in this regard that she could remember.  Pleasant woman here  for the visit by herself.  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  Lives by herself.  Mentions no significant limitations with activity at home or at work.  Does not routinely exercise at this time.  Mentions she has gained 40 pounds in the past 6 years. Mentions over the past few months she has been observing her feet and legs to be swollen up, with imprint from tight clothing or socks seen on the skin. Intermittently also reports swelling of her hands. No orthopnea paroxysmal nocturnal dyspnea. No other significant cardiac symptoms.  Remote history of syncope about 10 -12years ago while still in high school as she stood up and walked to the kitchen and felt lightheaded and was subsequently passed out for few minutes.  Had evaluation in the ER at that time and found nothing significant.  No further recurrence of these episodes.  Does not smoke. Does not drink alcohol. No illicit drug use. Does not drink coffee or tea. Drinks caffeinated sodas usually more than 1 a day.  EKG in the clinic today shows sinus rhythm with sinus arrhythmia heart rate 54/min, PR interval 134 ms, QRS duration 94 ms RSR prime pattern in lead V1 consistent with incomplete RBBB morphology,, QTc 388 ms.  Lipid panel from 10/06/2023 total cholesterol 167, triglycerides 58, HDL 59, LDL 96. proBNP checked by PCP 10/06/2023 was normal 13. Thyroid  panel was normal. Thyroid  peroxidase antibody levels were noted to be normal.  Previously 5 years ago this was slightly abnormal. Complete metabolic panel and CBC was unremarkable.  Past Medical History:  Diagnosis Date   ADD (attention deficit disorder)    Allergy    Asthma    Urticaria     History reviewed. No pertinent surgical history.  Current Medications: Current Meds  Medication Sig   albuterol  (VENTOLIN  HFA) 108 (90 Base) MCG/ACT inhaler Inhale 2 puffs into the lungs every 4 (four) hours as needed for wheezing or shortness of breath.   fluticasone   (FLONASE ) 50 MCG/ACT nasal spray 2 SPRAYS PER NOSTRIL DAILY AS NEEDED FOR STUFFY NOSE.   levocetirizine (XYZAL ) 5 MG tablet Take 1 tablet (5 mg total) by mouth daily as needed for allergies. One tablet twice daily as needed for itching   naproxen  (NAPROSYN ) 500 MG tablet Take 1 tablet (500 mg total) by mouth 2 (two) times daily with a meal. Start 3 days before menses and continue through most painful days of menses   triamcinolone  cream (KENALOG ) 0.1 % Apply 1 Application topically 2 (two) times daily.   Vitamin D , Ergocalciferol , (DRISDOL ) 1.25 MG (50000 UNIT) CAPS capsule Take 1 capsule (50,000 Units total) by mouth every 7 (seven) days.     Allergies:   Penicillins   Social History   Socioeconomic History   Marital status: Single    Spouse name: Not on file   Number of children: Not on file   Years of education: Not on file   Highest education level: Some college, no degree  Occupational History   Not on file  Tobacco Use   Smoking status: Never   Smokeless tobacco: Never  Vaping Use   Vaping status: Never Used  Substance and Sexual Activity   Alcohol use: No   Drug use: No   Sexual activity: Not Currently    Birth control/protection: None  Other Topics Concern   Not on file  Social History Narrative   Not on file   Social Drivers of Health   Financial Resource Strain: Low Risk  (12/15/2023)   Overall Financial Resource Strain (CARDIA)    Difficulty of Paying Living Expenses: Not hard at all  Food Insecurity: No Food Insecurity (12/15/2023)   Hunger Vital Sign    Worried About Running Out of Food in the Last Year: Never true    Ran Out of Food in the Last Year: Never true  Transportation Needs: No Transportation Needs (12/15/2023)   PRAPARE - Administrator, Civil Service (Medical): No    Lack of Transportation (Non-Medical): No  Physical Activity: Sufficiently Active (12/15/2023)   Exercise Vital Sign    Days of Exercise per Week: 5 days    Minutes of  Exercise per Session: 90 min  Stress: No Stress Concern Present (12/15/2023)   [employer] of Occupational Health - Occupational Stress Questionnaire    Feeling of Stress: Only a little  Social Connections: Moderately Integrated (12/15/2023)   Social Connection and Isolation Panel    Frequency of Communication with Friends and Family: More than three times a week    Frequency of Social Gatherings with Friends and Family: Twice a week    Attends Religious Services: More than 4 times per year    Active Member of [employer] or Organizations: Yes    Attends Banker Meetings: More than 4 times per year    Marital Status: Never married  Recent Concern: Social Connections - Moderately Isolated (10/02/2023)   Social Connection and Isolation Panel    Frequency of Communication with Friends and Family: Three times a week    Frequency of Social Gatherings  with Friends and Family: Three times a week    Attends Religious Services: More than 4 times per year    Active Member of Clubs or Organizations: No    Attends Engineer, structural: Not on file    Marital Status: Never married     Family History: The patient's family history includes ADD / ADHD in her brother; Allergic rhinitis in her maternal grandmother; Diabetes in her maternal grandmother; Hearing loss in her maternal grandmother; Hyperlipidemia in her maternal grandmother; Hypertension in her maternal grandmother; Kidney disease in her maternal grandmother; Learning disabilities in her brother; Obesity in her brother. There is no history of Angioedema, Asthma, Eczema, Immunodeficiency, or Urticaria. ROS:   Please see the history of present illness.    All 14 point review of systems negative except as described per history of present illness.  EKGs/Labs/Other Studies Reviewed:    The following studies were reviewed today:   EKG:  EKG Interpretation Date/Time:  Thursday March 01 2024 10:42:05 EDT Ventricular Rate:   54 PR Interval:  134 QRS Duration:  94 QT Interval:  410 QTC Calculation: 388 R Axis:   100  Text Interpretation: Sinus bradycardia with marked sinus arrhythmia Rightward axis Incomplete right bundle branch block Borderline ECG No previous ECGs available Confirmed by Liborio Hai reddy [phone]) on 03/01/2024 11:01:04 AM    Recent Labs: 10/06/2023: ALT 10; BUN 12; Creatinine, Ser 0.76; Hemoglobin 12.4; Platelets 213.0; Potassium 3.8; Pro B Natriuretic peptide (BNP) 13.0; Sodium 138  Recent Lipid Panel    Component Value Date/Time   CHOL 167 10/06/2023 1632   TRIG 58.0 10/06/2023 1632   HDL 59.20 10/06/2023 1632   CHOLHDL 3 10/06/2023 1632   VLDL 11.6 10/06/2023 1632   LDLCALC 96 10/06/2023 1632    Physical Exam:    VS:  BP 100/66   Pulse (!) 54   Ht 5' 8 (1.727 m)   Wt 187 lb (84.8 kg)   LMP 01/31/2024 (Exact Date)   SpO2 97%   BMI 28.43 kg/m     Wt Readings from Last 3 Encounters:  03/01/24 187 lb (84.8 kg)  02/10/24 185 lb (83.9 kg)  12/16/23 189 lb (85.7 kg)     GENERAL:  Well nourished, well developed in no acute distress NECK: No JVD; No carotid bruits CARDIAC: RRR, S1 and S2 present, no murmurs, no rubs, no gallops CHEST:  Clear to auscultation without rales, wheezing or rhonchi  Extremities: No pitting pedal edema. Pulses bilaterally symmetric with radial 2+ and dorsalis pedis 2+ NEUROLOGIC:  Alert and oriented x 3  Medication Adjustments/Labs and Tests Ordered: Current medicines are reviewed at length with the patient today.  Concerns regarding medicines are outlined above.  Orders Placed This Encounter  Procedures   EKG 12-Lead   ECHOCARDIOGRAM COMPLETE   No orders of the defined types were placed in this encounter.   Signed, Hai jess Liborio, MD, MPH, [facility]. 03/01/2024 11:25 AM    [facility]

## 2024-03-01 NOTE — Patient Instructions (Signed)
 Medication Instructions:  Your physician recommends that you continue on your current medications as directed. Please refer to the Current Medication list given to you today.  *If you need a refill on your cardiac medications before your next appointment, please call your pharmacy*   Lab Work: None Ordered If you have labs (blood work) drawn today and your tests are completely normal, you will receive your results only by: MyChart Message (if you have MyChart) OR A paper copy in the mail If you have any lab test that is abnormal or we need to change your treatment, we will call you to review the results.   Testing/Procedures: Your physician has requested that you have an echocardiogram. Echocardiography is a painless test that uses sound waves to create images of your heart. It provides your doctor with information about the size and shape of your heart and how well your heart's chambers and valves are working. This procedure takes approximately one hour. There are no restrictions for this procedure. Please do NOT wear cologne, perfume, aftershave, or lotions (deodorant is allowed). Please arrive 15 minutes prior to your appointment time.  Please note: We ask at that you not bring children with you during ultrasound (echo/ vascular) testing. Due to room size and safety concerns, children are not allowed in the ultrasound rooms during exams. Our front office staff cannot provide observation of children in our lobby area while testing is being conducted. An adult accompanying a patient to their appointment will only be allowed in the ultrasound room at the discretion of the ultrasound technician under special circumstances. We apologize for any inconvenience.    Follow-Up: At [facility], you and your health needs are our priority.  As part of our continuing mission to provide you with exceptional heart care, we have created designated Provider Care Teams.  These Care Teams include your  primary Cardiologist (physician) and Advanced Practice Providers (APPs -  Physician Assistants and Nurse Practitioners) who all work together to provide you with the care you need, when you need it.  We recommend signing up for the patient portal called MyChart.  Sign up information is provided on this After Visit Summary.  MyChart is used to connect with patients for Virtual Visits (Telemedicine).  Patients are able to view lab/test results, encounter notes, upcoming appointments, etc.  Non-urgent messages can be sent to your provider as well.   To learn more about what you can do with MyChart, go to [email or web address].    Your next appointment:   Based on test results  The format for your next appointment:   In Person  Provider:   Alean Madireddy,MD   Other Instructions NA

## 2024-03-01 NOTE — Assessment & Plan Note (Addendum)
 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  Discussed pathophysiology of VSD and various types and degree of shunt.. Clinically no significant physical exam findings, no murmur. Will proceed with transthoracic echocardiogram for further evaluation. 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  Her lower extremity edema concerns not consistent with pitting pedal edema.  Likely more related to subcutaneous fat. BNP is normal. With no significant diastolic dysfunction on echocardiogram, no further cardiac workup is required. In the interim I recommended her to pursue low-salt diet.

## 2024-03-23 ENCOUNTER — Ambulatory Visit

## 2024-03-23 DIAGNOSIS — Q210 Ventricular septal defect: Secondary | ICD-10-CM

## 2024-03-25 LAB — ECHOCARDIOGRAM COMPLETE
AV Vena cont: 0.2 cm
Area-P 1/2: 3.15 cm2
P 1/2 time: 688 ms
S' Lateral: 3.6 cm

## 2024-04-11 ENCOUNTER — Other Ambulatory Visit: Payer: Self-pay

## 2024-04-11 DIAGNOSIS — F32A Depression, unspecified: Secondary | ICD-10-CM | POA: Diagnosis not present

## 2024-04-11 DIAGNOSIS — F411 Generalized anxiety disorder: Secondary | ICD-10-CM | POA: Diagnosis not present

## 2024-04-11 DIAGNOSIS — F900 Attention-deficit hyperactivity disorder, predominantly inattentive type: Secondary | ICD-10-CM | POA: Diagnosis not present

## 2024-04-11 MED ORDER — DYANAVEL XR 10 MG PO TBCR
1.0000 | EXTENDED_RELEASE_TABLET | Freq: Every day | ORAL | 0 refills | Status: AC
  Filled 2024-04-11 – 2024-05-08 (×2): qty 30, 30d supply, fill #0

## 2024-04-12 ENCOUNTER — Other Ambulatory Visit: Payer: Self-pay

## 2024-04-20 ENCOUNTER — Other Ambulatory Visit: Payer: Self-pay

## 2024-04-23 ENCOUNTER — Other Ambulatory Visit: Payer: Self-pay

## 2024-04-24 ENCOUNTER — Other Ambulatory Visit: Payer: Self-pay

## 2024-04-27 ENCOUNTER — Other Ambulatory Visit: Payer: Self-pay

## 2024-05-08 ENCOUNTER — Other Ambulatory Visit: Payer: Self-pay

## 2024-06-01 ENCOUNTER — Ambulatory Visit: Admitting: Family Medicine

## 2024-06-01 ENCOUNTER — Other Ambulatory Visit (HOSPITAL_BASED_OUTPATIENT_CLINIC_OR_DEPARTMENT_OTHER): Payer: Self-pay

## 2024-06-01 ENCOUNTER — Encounter: Payer: Self-pay | Admitting: Family Medicine

## 2024-06-01 VITALS — BP 96/68 | HR 77 | Temp 98.6°F | Ht 68.0 in | Wt 193.0 lb

## 2024-06-01 DIAGNOSIS — L02818 Cutaneous abscess of other sites: Secondary | ICD-10-CM | POA: Diagnosis not present

## 2024-06-01 DIAGNOSIS — N390 Urinary tract infection, site not specified: Secondary | ICD-10-CM

## 2024-06-01 MED ORDER — SULFAMETHOXAZOLE-TRIMETHOPRIM 800-160 MG PO TABS
1.0000 | ORAL_TABLET | Freq: Two times a day (BID) | ORAL | 1 refills | Status: AC
  Filled 2024-06-01: qty 14, 7d supply, fill #0

## 2024-06-01 MED ORDER — SULFAMETHOXAZOLE-TRIMETHOPRIM 800-160 MG PO TABS
1.0000 | ORAL_TABLET | Freq: Two times a day (BID) | ORAL | 0 refills | Status: DC
  Filled 2024-06-01: qty 14, 7d supply, fill #0

## 2024-06-01 NOTE — Progress Notes (Signed)
 "  Acute Office Visit  Subjective:     Patient ID: Janet Spencer, female    DOB: [birth year], 33 y.o.   MRN: [id number]  Chief Complaint  Patient presents with   Cyst    Possible boil on left side of genital area noticed it Tuesday painful when pressure is on it between the size of a dime and nickel looks like a head it starting to form     HPI  Discussed the use of AI scribe software for clinical note transcription with the patient, who gave verbal consent to proceed.  History of Present Illness Janet Spencer is a 33 year old female who presents with a possible boil on the left side.  Cutaneous lesion - Tender area on the left side noticed on Tuesday - Mild tenderness, worsened by turning in bed or manipulation - Lesion feels slightly mobile - Small head starting to form, observed by her mother - No mention of drainage, fever, or surrounding erythema  Recurrent post-coital urinary tract infections - Recurrent UTIs following sexual intercourse - Associated dysuria - Previous episodes have responded well to Macrobid - Has leftover Macrobid at home - Currently married and planning pregnancy - Seeking discussion of prevention options  Antibiotic-associated yeast infections and concerns - Develops yeast infections with antibiotic use - Concerned about antibiotic resistance and long-term antibiotic use  Medication allergies - Allergic to penicillin     ROS Per HPI      Objective:    BP 96/68 (BP Location: Left Arm, Patient Position: Sitting)   Pulse 77   Temp 98.6 F (37 C) (Temporal)   Ht 5' 8 (1.727 m)   Wt 193 lb (87.5 kg)   SpO2 95%   BMI 29.35 kg/m    Physical Exam Vitals and nursing note reviewed. Exam conducted with a chaperone present Edwardo Ahle, CMA).  Constitutional:      General: She is not in acute distress.    Appearance: Normal appearance. She is normal weight.  HENT:     Head: Normocephalic and atraumatic.     Right Ear: External ear  normal.     Left Ear: External ear normal.     Nose: Nose normal.     Mouth/Throat:     Mouth: Mucous membranes are moist.     Pharynx: Oropharynx is clear.  Eyes:     Extraocular Movements: Extraocular movements intact.     Pupils: Pupils are equal, round, and reactive to light.  Cardiovascular:     Rate and Rhythm: Normal rate.  Pulmonary:     Effort: Pulmonary effort is normal.  Genitourinary:     Comments: Area of concern, mild tenderness, minimal swelling, minimal erythema.  No open lesion, no discharge, no bleeding Musculoskeletal:        General: Normal range of motion.     Cervical back: Normal range of motion.     Right lower leg: No edema.     Left lower leg: No edema.  Lymphadenopathy:     Cervical: No cervical adenopathy.  Neurological:     General: No focal deficit present.     Mental Status: She is alert and oriented to person, place, and time.  Psychiatric:        Mood and Affect: Mood normal.        Thought Content: Thought content normal.     No results found for any visits on 06/01/24.      Assessment & Plan:   Assessment and Plan Assessment &  Plan Cutaneous abscess of vulva Possible abscess on left vulva, tender but not painful unless manipulated. Differential includes lymph node inflammation. No immediate drainage needed. Risk of rapid progression if untreated. - Prescribed Bactrim  for infection and inflammation.  Recurrent urinary tract infection Recurrent UTIs post-coital with positive urine cultures. Concerns about long-term antibiotic use and resistance. Bactrim  preferred over Cipro due to lower risk profile. - Ordered urine culture to identify causative organism. - Prescribed lower dose Bactrim  for as-needed use post-coital. - Discussed Eucora Defend as a preventative measure for UTIs. - Advised to use Bactrim  for the first month while on Eucora Defend, then reassess.     Orders Placed This Encounter  Procedures   Urine Culture     Standing Status:   Future    Number of Occurrences:   1    Expiration Date:   07/02/2024     Meds ordered this encounter  Medications   DISCONTD: sulfamethoxazole -trimethoprim  (BACTRIM  DS) 800-160 MG tablet    Sig: Take 1 tablet by mouth 2 (two) times daily for 7 days.    Dispense:  14 tablet    Refill:  0   sulfamethoxazole -trimethoprim  (BACTRIM  DS) 800-160 MG tablet    Sig: Take 1 tablet by mouth 2 (two) times daily.    Dispense:  14 tablet    Refill:  1    Return if symptoms worsen or fail to improve.  Corean LITTIE Ku, FNP  "

## 2024-06-01 NOTE — Patient Instructions (Addendum)
 I have sent in Bactrim  for you to take twice a day for 7 days.  Please eat with this medication as it can cause an upset stomach if you do not.  I have also sent in Diflucan for you to take in case of yeast after antibiotic treatment.  If you are having symptoms when you complete antibiotics, may take 1 tablet.  If you are still having symptoms 3 days later, may take the second tablet.  I have sent in a refill of the Bactrim  as well for you to take on days that you are sexually active to help prevent UTIs.  We are going to get a urine culture for you today as well to see if we can narrow down the cause of your recurrent UTIs.  Please let me know how this is working for you.

## 2024-06-02 LAB — URINE CULTURE

## 2024-06-04 ENCOUNTER — Ambulatory Visit: Payer: Self-pay | Admitting: Family Medicine

## 2024-07-04 ENCOUNTER — Other Ambulatory Visit (HOSPITAL_BASED_OUTPATIENT_CLINIC_OR_DEPARTMENT_OTHER): Payer: Self-pay

## 2024-07-04 MED ORDER — LISDEXAMFETAMINE DIMESYLATE 30 MG PO CAPS
30.0000 mg | ORAL_CAPSULE | Freq: Every day | ORAL | 0 refills | Status: AC
  Filled 2024-07-04: qty 30, 30d supply, fill #0

## 2024-07-05 ENCOUNTER — Other Ambulatory Visit (HOSPITAL_BASED_OUTPATIENT_CLINIC_OR_DEPARTMENT_OTHER): Payer: Self-pay

## 2024-07-31 ENCOUNTER — Ambulatory Visit: Admitting: Dermatology
